# Patient Record
Sex: Male | Born: 1950
Health system: Southern US, Community
[De-identification: ages and names within clinical notes are randomized; demographics above are authoritative.]

## PROBLEM LIST (undated history)

## (undated) DIAGNOSIS — K4090 Unilateral inguinal hernia, without obstruction or gangrene, not specified as recurrent: Secondary | ICD-10-CM

## (undated) DIAGNOSIS — G479 Sleep disorder, unspecified: Secondary | ICD-10-CM

## (undated) DIAGNOSIS — N183 Chronic kidney disease, stage 3 unspecified: Secondary | ICD-10-CM

## (undated) DIAGNOSIS — K219 Gastro-esophageal reflux disease without esophagitis: Secondary | ICD-10-CM

## (undated) DIAGNOSIS — I10 Essential (primary) hypertension: Secondary | ICD-10-CM

## (undated) DIAGNOSIS — E785 Hyperlipidemia, unspecified: Secondary | ICD-10-CM

## (undated) DIAGNOSIS — IMO0001 Reserved for inherently not codable concepts without codable children: Secondary | ICD-10-CM

## (undated) DIAGNOSIS — M545 Low back pain, unspecified: Secondary | ICD-10-CM

## (undated) DIAGNOSIS — E559 Vitamin D deficiency, unspecified: Secondary | ICD-10-CM

## (undated) HISTORY — DX: Gastro-esophageal reflux disease without esophagitis: K21.9

## (undated) HISTORY — DX: Reserved for inherently not codable concepts without codable children: IMO0001

## (undated) HISTORY — DX: Sleep disorder, unspecified: G47.9

## (undated) HISTORY — DX: Unilateral inguinal hernia, without obstruction or gangrene, not specified as recurrent: K40.90

## (undated) HISTORY — DX: Hyperlipidemia, unspecified: E78.5

## (undated) HISTORY — DX: Low back pain: M54.5

## (undated) HISTORY — DX: Vitamin D deficiency, unspecified: E55.9

## (undated) HISTORY — DX: Essential (primary) hypertension: I10

## (undated) HISTORY — PX: HERNIA REPAIR: SHX51

## (undated) HISTORY — DX: Chronic kidney disease, stage 3 unspecified: N18.30

## (undated) HISTORY — DX: Low back pain, unspecified: M54.50

## (undated) HISTORY — DX: Chronic kidney disease, stage 3 (moderate): N18.3

---

## 2003-09-12 ENCOUNTER — Ambulatory Visit (HOSPITAL_COMMUNITY): Admission: RE | Admit: 2003-09-12 | Discharge: 2003-09-12 | Payer: Self-pay | Admitting: Gastroenterology

## 2004-10-28 ENCOUNTER — Ambulatory Visit (HOSPITAL_COMMUNITY): Admission: RE | Admit: 2004-10-28 | Discharge: 2004-10-28 | Payer: Self-pay | Admitting: Surgery

## 2004-10-28 ENCOUNTER — Ambulatory Visit (HOSPITAL_BASED_OUTPATIENT_CLINIC_OR_DEPARTMENT_OTHER): Admission: RE | Admit: 2004-10-28 | Discharge: 2004-10-28 | Payer: Self-pay | Admitting: Surgery

## 2004-11-22 ENCOUNTER — Ambulatory Visit (HOSPITAL_COMMUNITY): Admission: RE | Admit: 2004-11-22 | Discharge: 2004-11-22 | Payer: Self-pay | Admitting: Internal Medicine

## 2011-02-08 ENCOUNTER — Other Ambulatory Visit: Payer: Self-pay | Admitting: Family Medicine

## 2011-02-08 DIAGNOSIS — Z87891 Personal history of nicotine dependence: Secondary | ICD-10-CM

## 2011-02-08 DIAGNOSIS — Z801 Family history of malignant neoplasm of trachea, bronchus and lung: Secondary | ICD-10-CM

## 2011-02-09 ENCOUNTER — Other Ambulatory Visit: Payer: Self-pay

## 2011-02-09 ENCOUNTER — Ambulatory Visit
Admission: RE | Admit: 2011-02-09 | Discharge: 2011-02-09 | Disposition: A | Discharge status: Home / Self Care | Payer: 59 | Source: Ambulatory Visit | Attending: Family Medicine | Admitting: Family Medicine

## 2011-02-09 DIAGNOSIS — Z87891 Personal history of nicotine dependence: Secondary | ICD-10-CM

## 2011-02-09 DIAGNOSIS — Z801 Family history of malignant neoplasm of trachea, bronchus and lung: Secondary | ICD-10-CM

## 2012-10-24 ENCOUNTER — Ambulatory Visit (INDEPENDENT_AMBULATORY_CARE_PROVIDER_SITE_OTHER): Payer: 59 | Admitting: General Surgery

## 2012-10-26 ENCOUNTER — Encounter (INDEPENDENT_AMBULATORY_CARE_PROVIDER_SITE_OTHER): Payer: Self-pay | Admitting: General Surgery

## 2012-10-26 ENCOUNTER — Telehealth (INDEPENDENT_AMBULATORY_CARE_PROVIDER_SITE_OTHER): Payer: Self-pay | Admitting: General Surgery

## 2012-10-26 NOTE — Telephone Encounter (Signed)
Called [place] at [facility] [place] [phone]) and requested copy of the most recent tests on this patient to confirm the diagnosis given for visit with Dr. Derrell Lolling next week.

## 2012-10-31 ENCOUNTER — Ambulatory Visit (INDEPENDENT_AMBULATORY_CARE_PROVIDER_SITE_OTHER): Payer: 59 | Admitting: General Surgery

## 2012-10-31 ENCOUNTER — Encounter (INDEPENDENT_AMBULATORY_CARE_PROVIDER_SITE_OTHER): Payer: Self-pay | Admitting: General Surgery

## 2012-10-31 VITALS — BP 126/80 | HR 80 | Temp 97.8°F | Resp 18 | Ht 69.0 in | Wt 190.0 lb

## 2012-10-31 DIAGNOSIS — K4090 Unilateral inguinal hernia, without obstruction or gangrene, not specified as recurrent: Secondary | ICD-10-CM

## 2012-10-31 NOTE — Patient Instructions (Signed)
You have a small right inguinal hernia. We have discussed the options for surgical repair.  Since your pain has gotten better and the hernia is small, surgical repair of this is elective..  Call Dr. Derrell Lolling back when you are ready to schedule the surgery.     Inguinal Hernia, Adult Muscles help keep everything in the body in its proper place. But if a weak spot in the muscles develops, something can poke through. That is called a hernia. When this happens in the lower part of the belly (abdomen), it is called an inguinal hernia. (It takes its name from a part of the body in this region called the inguinal canal.) A weak spot in the wall of muscles lets some fat or part of the small intestine bulge through. An inguinal hernia can develop at any age. Men get them more often than women. CAUSES  In adults, an inguinal hernia develops over time.  It can be triggered by:  Suddenly straining the muscles of the lower abdomen.  Lifting heavy objects.  Straining to have a bowel movement. Difficult bowel movements (constipation) can lead to this.  Constant coughing. This may be caused by smoking or lung disease.  Being overweight.  Being pregnant.  Working at a job that requires long periods of standing or heavy lifting.  Having had an inguinal hernia before. One type can be an emergency situation. It is called a strangulated inguinal hernia. It develops if part of the small intestine slips through the weak spot and cannot get back into the abdomen. The blood supply can be cut off. If that happens, part of the intestine may die. This situation requires emergency surgery. SYMPTOMS  Often, a small inguinal hernia has no symptoms. It is found when a healthcare provider does a physical exam. Larger hernias usually have symptoms.   In adults, symptoms may include:  A lump in the groin. This is easier to see when the person is standing. It might disappear when lying down.  In men, a lump in the  scrotum.  Pain or burning in the groin. This occurs especially when lifting, straining or coughing.  A dull ache or feeling of pressure in the groin.  Signs of a strangulated hernia can include:  A bulge in the groin that becomes very painful and tender to the touch.  A bulge that turns red or purple.  Fever, nausea and vomiting.  Inability to have a bowel movement or to pass gas. DIAGNOSIS  To decide if you have an inguinal hernia, a healthcare provider will probably do a physical examination.  This will include asking questions about any symptoms you have noticed.  The healthcare provider might feel the groin area and ask you to cough. If an inguinal hernia is felt, the healthcare provider may try to slide it back into the abdomen.  Usually no other tests are needed. TREATMENT  Treatments can vary. The size of the hernia makes a difference. Options include:  Watchful waiting. This is often suggested if the hernia is small and you have had no symptoms.  No medical procedure will be done unless symptoms develop.  You will need to watch closely for symptoms. If any occur, contact your healthcare provider right away.  Surgery. This is used if the hernia is larger or you have symptoms.  Open surgery. This is usually an outpatient procedure (you will not stay overnight in a hospital). An cut (incision) is made through the skin in the groin. The hernia  is put back inside the abdomen. The weak area in the muscles is then repaired by herniorrhaphy or hernioplasty. Herniorrhaphy: in this type of surgery, the weak muscles are sewn back together. Hernioplasty: a patch or mesh is used to close the weak area in the abdominal wall.  Laparoscopy. In this procedure, a surgeon makes small incisions. A thin tube with a tiny video camera (called a laparoscope) is put into the abdomen. The surgeon repairs the hernia with mesh by looking with the video camera and using two long instruments. HOME  CARE INSTRUCTIONS   After surgery to repair an inguinal hernia:  You will need to take pain medicine prescribed by your healthcare provider. Follow all directions carefully.  You will need to take care of the wound from the incision.  Your activity will be restricted for awhile. This will probably include no heavy lifting for several weeks. You also should not do anything too active for a few weeks. When you can return to work will depend on the type of job that you have.  During "watchful waiting" periods, you should:  Maintain a healthy weight.  Eat a diet high in fiber (fruits, vegetables and whole grains).  Drink plenty of fluids to avoid constipation. This means drinking enough water and other liquids to keep your urine clear or pale yellow.  Do not lift heavy objects.  Do not stand for long periods of time.  Quit smoking. This should keep you from developing a frequent cough. SEEK MEDICAL CARE IF:   A bulge develops in your groin area.  You feel pain, a burning sensation or pressure in the groin. This might be worse if you are lifting or straining.  You develop a fever of more than 100.5 F (38.1 C). SEEK IMMEDIATE MEDICAL CARE IF:   Pain in the groin increases suddenly.  A bulge in the groin gets bigger suddenly and does not go down.  For men, there is sudden pain in the scrotum. Or, the size of the scrotum increases.  A bulge in the groin area becomes red or purple and is painful to touch.  You have nausea or vomiting that does not go away.  You feel your heart beating much faster than normal.  You cannot have a bowel movement or pass gas.  You develop a fever of more than 102.0 F (38.9 C). Document Released: 02/13/2009 Document Revised: 12/20/2011 Document Reviewed: 02/13/2009 [facility] Patient Information 2013 [place], [place].   Inguinal Hernia, Adult  Care After Refer to this sheet in the next few weeks. These discharge instructions provide you with  general information on caring for yourself after you leave the hospital. Your caregiver may also give you specific instructions. Your treatment has been planned according to the most current medical practices available, but unavoidable complications sometimes occur. If you have any problems or questions after discharge, please call your caregiver. HOME CARE INSTRUCTIONS  Put ice on the operative site.  Put ice in a plastic bag.  Place a towel between your skin and the bag.  Leave the ice on for 15 to 20 minutes at a time, 3 to 4 times a day while awake.  Change bandages (dressings) as directed.  Keep the wound dry and clean. The wound may be washed gently with soap and water. Gently blot or dab the wound dry. It is okay to take showers 24 to 48 hours after surgery. Do not take baths, use swimming pools, or use hot tubs for 10 days, or  as directed by your caregiver.  Only take over-the-counter or prescription medicines for pain, discomfort, or fever as directed by your caregiver.  Continue your normal diet as directed.  Do not lift anything more than 10 pounds or play contact sports for 3 weeks, or as directed. SEEK MEDICAL CARE IF:  There is redness, swelling, or increasing pain in the wound.  There is fluid (pus) coming from the wound.  There is drainage from a wound lasting longer than 1 day.  You have an oral temperature above 102 F (38.9 C).  You notice a bad smell coming from the wound or dressing.  The wound breaks open after the stitches (sutures) have been removed.  You notice increasing pain in the shoulders (shoulder strap areas).  You develop dizzy episodes or fainting while standing.  You feel sick to your stomach (nauseous) or throw up (vomit). SEEK IMMEDIATE MEDICAL CARE IF:  You develop a rash.  You have difficulty breathing.  You develop a reaction or have side effects to medicines you were given. MAKE SURE YOU:   Understand these  instructions.  Will watch your condition.  Will get help right away if you are not doing well or get worse. Document Released: 10/28/2006 Document Revised: 12/20/2011 Document Reviewed: 08/27/2009 [facility] Patient Information 2013 [place], [place].

## 2012-10-31 NOTE — Progress Notes (Signed)
Patient ID: Carlos Young, male   DOB: 1951/02/01, 62 y.o.   MRN: [id number]  Chief Complaint  Patient presents with  . New Evaluation    groin hernia    HPI Carlos Young is a 62 y.o. male.  He is referred to me by Dr. Vianne Bulls for evaluation of a right inguinal hernia.  The patient has had an umbilical hernia repair with mesh a few years ago, but has never had an inguinal hernia. About 4 months ago he had a bad cold and was coughing and sneezing and noticed pain and a small bulge in his right groin. This has gotten better and although he still feels a little bit of  discomfort it is not interfering with his activities. He is here for evaluation.  Comorbidities include hypertension and hyperlipidemia, otherwise well.  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 HPI  Past Medical History  Diagnosis Date  . Hypertension   . Hyperlipidemia   . GERD (gastroesophageal reflux disease)   . Inguinal hernia   . Low back pain   . Myalgia and myositis, unspecified   . Sleep disturbance, unspecified   . Unspecified vitamin D deficiency   . Chronic kidney disease, stage III (moderate)     Past Surgical History  Procedure Date  . Hernia repair 2008 est    ing. hernia    Family History  Problem Relation Age of Onset  . Cancer Mother     lung ca  . Stroke Father   . Cancer Sister     breast ca  late 81s   . Stroke Sister   . Arthritis Sister     Social History History  Substance Use Topics  . Smoking status: Former Smoker    Quit date: 10/26/1992  . Smokeless tobacco: Never Used  . Alcohol Use: 0.6 oz/week    1 Cans of beer per week     Comment: per week or less    Allergies  Allergen Reactions  . Ace Inhibitors   . Penicillins   . Sulfa Antibiotics     Current Outpatient Prescriptions  Medication Sig Dispense Refill  . amlodipine-atorvastatin (CADUET) 10-20 MG per tablet Take 1 tablet by mouth daily.       Marland Kitchen dexlansoprazole (DEXILANT) 60 MG capsule Take 60 mg by mouth daily.      Marland Kitchen olmesartan (BENICAR) 20 MG tablet Take 20 mg by mouth daily.      . tadalafil (CIALIS) 20 MG tablet Take 20 mg by mouth daily as needed.      . cholecalciferol (VITAMIN D) 1000 UNITS tablet Take 1,000 Units by mouth daily.        Review of Systems Review of Systems  Constitutional: Negative for fever, chills and unexpected weight change.  HENT: Negative for hearing loss, congestion, sore throat, trouble swallowing and voice change.   Eyes: Negative for visual disturbance.  Respiratory: Negative for cough and wheezing.   Cardiovascular: Negative for chest pain, palpitations and leg swelling.  Gastrointestinal: Negative for nausea, vomiting, abdominal pain, diarrhea, constipation, blood in stool, abdominal distention, anal bleeding and rectal pain.  Genitourinary: Negative for hematuria and difficulty urinating.  Musculoskeletal: Negative for arthralgias.  Skin: Negative for rash and wound.  Neurological: Negative for seizures, syncope, weakness and headaches.  Hematological: Negative for adenopathy. Does not bruise/bleed easily.  Psychiatric/Behavioral: Negative for confusion.    Blood pressure  126/80, pulse 80, temperature 97.8 F (36.6 C), resp. rate 18, height 5\' 9"  (1.753 m), weight 190 lb (86.183 kg).  Physical Exam Physical Exam  Constitutional: He is oriented to person, place, and time. He appears well-developed and well-nourished. No distress.  HENT:  Head: Normocephalic.  Nose: Nose normal.  Mouth/Throat: No oropharyngeal exudate.  Eyes: Conjunctivae normal and EOM are normal. Pupils are equal, round, and reactive to light. Right eye exhibits no discharge. Left eye exhibits no discharge. No scleral icterus.  Neck: Normal range of motion. Neck supple. No JVD present. No tracheal deviation present. No thyromegaly present.  Cardiovascular: Normal rate, regular rhythm, normal heart sounds and  intact distal pulses.   No murmur heard. Pulmonary/Chest: Effort normal and breath sounds normal. No stridor. No respiratory distress. He has no wheezes. He has no rales. He exhibits no tenderness.  Abdominal: Soft. Bowel sounds are normal. He exhibits no distension and no mass. There is no tenderness. There is no rebound and no guarding.       Transverse scar below umbilicus. Well-healed. No hernia. Small right inguinal hernia, detectable only when standing,subtle. No evidence of hernia on the left. Testes atrophic. No scrotal mass.  Musculoskeletal: Normal range of motion. He exhibits no edema and no tenderness.  Lymphadenopathy:    He has no cervical adenopathy.  Neurological: He is alert and oriented to person, place, and time. He has normal reflexes. Coordination normal.  Skin: Skin is warm and dry. No rash noted. He is not diaphoretic. No erythema. No pallor.  Psychiatric: He has a normal mood and affect. His behavior is normal. Judgment and thought content normal.    Data Reviewed Office notes from Dr. Tenny Craw Assessment    Small, reducible, minimally symptomatic right inguinal hernia  Hypertension  Hyperlipidemia  History of umbilical hernia repair with mesh    Plan    I told the patient that he has a small right inguinal hernia, and that this would probably need to be repaired at some point. At this point in time it is minimally symptomatic and is not interfering with his activities, and so repair is elective and up to him  I discussed the indications, details, techniques, and numerous risks of the surgery with him. He understands all these issues. All his questions are answered.  At this point he wants to go home and think about this. He may want to put this off or might change his mind. He will call me back when he is ready to schedule.  No restrictions on his activities in the interim.       Angelia Mould. Derrell Lolling, M.D., [facility] General and  Minimally invasive Surgery Breast and Colorectal Surgery Office:   [phone] Pager:   [phone]  10/31/2012, 9:21 AM

## 2013-09-24 ENCOUNTER — Encounter: Payer: Self-pay | Admitting: *Deleted

## 2014-07-30 ENCOUNTER — Other Ambulatory Visit: Payer: Self-pay | Admitting: Family Medicine

## 2014-07-30 DIAGNOSIS — R1013 Epigastric pain: Secondary | ICD-10-CM

## 2014-08-07 ENCOUNTER — Ambulatory Visit
Admission: RE | Admit: 2014-08-07 | Discharge: 2014-08-07 | Disposition: A | Discharge status: Home / Self Care | Payer: 59 | Source: Ambulatory Visit | Attending: Family Medicine | Admitting: Family Medicine

## 2014-08-07 DIAGNOSIS — R1013 Epigastric pain: Secondary | ICD-10-CM

## 2014-08-08 ENCOUNTER — Other Ambulatory Visit: Payer: Self-pay | Admitting: Family Medicine

## 2014-08-08 DIAGNOSIS — R1013 Epigastric pain: Secondary | ICD-10-CM

## 2014-08-09 ENCOUNTER — Ambulatory Visit
Admission: RE | Admit: 2014-08-09 | Discharge: 2014-08-09 | Disposition: A | Discharge status: Home / Self Care | Payer: 59 | Source: Ambulatory Visit | Attending: Family Medicine | Admitting: Family Medicine

## 2014-08-09 DIAGNOSIS — R1013 Epigastric pain: Secondary | ICD-10-CM

## 2014-09-30 ENCOUNTER — Emergency Department (HOSPITAL_COMMUNITY)
Admission: EM | Admit: 2014-09-30 | Discharge: 2014-09-30 | Disposition: A | Discharge status: Home / Self Care | Payer: 59 | Attending: Emergency Medicine | Admitting: Emergency Medicine

## 2014-09-30 ENCOUNTER — Encounter (HOSPITAL_COMMUNITY): Payer: Self-pay | Admitting: *Deleted

## 2014-09-30 ENCOUNTER — Emergency Department (HOSPITAL_COMMUNITY): Payer: 59

## 2014-09-30 DIAGNOSIS — K5792 Diverticulitis of intestine, part unspecified, without perforation or abscess without bleeding: Secondary | ICD-10-CM

## 2014-09-30 DIAGNOSIS — Z880 Allergy status to penicillin: Secondary | ICD-10-CM | POA: Insufficient documentation

## 2014-09-30 DIAGNOSIS — Z8669 Personal history of other diseases of the nervous system and sense organs: Secondary | ICD-10-CM | POA: Diagnosis not present

## 2014-09-30 DIAGNOSIS — Z87891 Personal history of nicotine dependence: Secondary | ICD-10-CM | POA: Insufficient documentation

## 2014-09-30 DIAGNOSIS — R109 Unspecified abdominal pain: Secondary | ICD-10-CM | POA: Diagnosis present

## 2014-09-30 DIAGNOSIS — E785 Hyperlipidemia, unspecified: Secondary | ICD-10-CM | POA: Insufficient documentation

## 2014-09-30 DIAGNOSIS — R630 Anorexia: Secondary | ICD-10-CM | POA: Diagnosis not present

## 2014-09-30 DIAGNOSIS — Z79899 Other long term (current) drug therapy: Secondary | ICD-10-CM | POA: Diagnosis not present

## 2014-09-30 DIAGNOSIS — N183 Chronic kidney disease, stage 3 (moderate): Secondary | ICD-10-CM | POA: Diagnosis not present

## 2014-09-30 DIAGNOSIS — R52 Pain, unspecified: Secondary | ICD-10-CM

## 2014-09-30 DIAGNOSIS — K219 Gastro-esophageal reflux disease without esophagitis: Secondary | ICD-10-CM | POA: Insufficient documentation

## 2014-09-30 DIAGNOSIS — I129 Hypertensive chronic kidney disease with stage 1 through stage 4 chronic kidney disease, or unspecified chronic kidney disease: Secondary | ICD-10-CM | POA: Diagnosis not present

## 2014-09-30 DIAGNOSIS — E559 Vitamin D deficiency, unspecified: Secondary | ICD-10-CM | POA: Insufficient documentation

## 2014-09-30 DIAGNOSIS — Z905 Acquired absence of kidney: Secondary | ICD-10-CM

## 2014-09-30 LAB — COMPREHENSIVE METABOLIC PANEL
ALBUMIN: 3.7 g/dL (ref 3.5–5.2)
ALT: 12 U/L (ref 0–53)
AST: 17 U/L (ref 0–37)
Alkaline Phosphatase: 116 U/L (ref 39–117)
Anion gap: 10 (ref 5–15)
BILIRUBIN TOTAL: 0.9 mg/dL (ref 0.3–1.2)
BUN: 21 mg/dL (ref 6–23)
CHLORIDE: 102 meq/L (ref 96–112)
CO2: 26 meq/L (ref 19–32)
Calcium: 9.7 mg/dL (ref 8.4–10.5)
Creatinine, Ser: 1.68 mg/dL — ABNORMAL HIGH (ref 0.50–1.35)
GFR calc Af Amer: 49 mL/min — ABNORMAL LOW (ref >90)
GFR, EST NON AFRICAN AMERICAN: 42 mL/min — AB (ref >90)
GLUCOSE: 111 mg/dL — AB (ref 70–99)
POTASSIUM: 4.4 meq/L (ref 3.7–5.3)
SODIUM: 138 meq/L (ref 137–147)
Total Protein: 7.5 g/dL (ref 6.0–8.3)

## 2014-09-30 LAB — URINALYSIS, ROUTINE W REFLEX MICROSCOPIC
Bilirubin Urine: NEGATIVE
Glucose, UA: NEGATIVE mg/dL
Hgb urine dipstick: NEGATIVE
Ketones, ur: NEGATIVE mg/dL
LEUKOCYTES UA: NEGATIVE
Nitrite: NEGATIVE
PROTEIN: NEGATIVE mg/dL
Specific Gravity, Urine: 1.018 (ref 1.005–1.030)
UROBILINOGEN UA: 1 mg/dL (ref 0.0–1.0)
pH: 7 (ref 5.0–8.0)

## 2014-09-30 LAB — CBC
HCT: 44.1 % (ref 39.0–52.0)
HEMOGLOBIN: 14.8 g/dL (ref 13.0–17.0)
MCH: 30.3 pg (ref 26.0–34.0)
MCHC: 33.6 g/dL (ref 30.0–36.0)
MCV: 90.4 fL (ref 78.0–100.0)
Platelets: 254 10*3/uL (ref 150–400)
RBC: 4.88 MIL/uL (ref 4.22–5.81)
RDW: 13 % (ref 11.5–15.5)
WBC: 7.5 10*3/uL (ref 4.0–10.5)

## 2014-09-30 MED ORDER — IOHEXOL 300 MG/ML  SOLN
25.0000 mL | Freq: Once | INTRAMUSCULAR | Status: AC | PRN
  Administered 2014-09-30: 25 mL via ORAL

## 2014-09-30 MED ORDER — METRONIDAZOLE IN NACL 5-0.79 MG/ML-% IV SOLN
500.0000 mg | Freq: Once | INTRAVENOUS | Status: AC
  Administered 2014-09-30: 500 mg via INTRAVENOUS
  Filled 2014-09-30: qty 100

## 2014-09-30 MED ORDER — HYDROMORPHONE HCL 1 MG/ML IJ SOLN
0.5000 mg | Freq: Once | INTRAMUSCULAR | Status: AC
  Administered 2014-09-30: 0.5 mg via INTRAVENOUS
  Filled 2014-09-30: qty 1

## 2014-09-30 MED ORDER — CIPROFLOXACIN HCL 500 MG PO TABS: 500.0000 mg | ORAL_TABLET | Freq: Two times a day (BID) | ORAL | Status: DC

## 2014-09-30 MED ORDER — HYDROCODONE-ACETAMINOPHEN 5-325 MG PO TABS: 1.0000 | ORAL_TABLET | Freq: Four times a day (QID) | ORAL | Status: DC | PRN

## 2014-09-30 MED ORDER — CIPROFLOXACIN IN D5W 400 MG/200ML IV SOLN
400.0000 mg | Freq: Once | INTRAVENOUS | Status: AC
Start: 2014-09-30 — End: 2014-09-30
  Administered 2014-09-30: 400 mg via INTRAVENOUS
  Filled 2014-09-30: qty 200

## 2014-09-30 MED ORDER — ONDANSETRON 8 MG PO TBDP: 8.0000 mg | ORAL_TABLET | Freq: Three times a day (TID) | ORAL | Status: DC | PRN

## 2014-09-30 MED ORDER — SODIUM CHLORIDE 0.9 % IV BOLUS (SEPSIS)
1000.0000 mL | Freq: Once | INTRAVENOUS | Status: AC
  Administered 2014-09-30: 1000 mL via INTRAVENOUS

## 2014-09-30 MED ORDER — METRONIDAZOLE 500 MG PO TABS: 500.0000 mg | ORAL_TABLET | Freq: Four times a day (QID) | ORAL | Status: DC

## 2014-09-30 MED ORDER — ONDANSETRON HCL 4 MG/2ML IJ SOLN
4.0000 mg | Freq: Once | INTRAMUSCULAR | Status: AC
  Administered 2014-09-30: 4 mg via INTRAVENOUS
  Filled 2014-09-30: qty 2

## 2014-09-30 NOTE — ED Notes (Signed)
Patient returned from CT

## 2014-09-30 NOTE — ED Notes (Signed)
Pt remains monitored by blood pressure and pulse ox. Pts family remains at bedside.  

## 2014-09-30 NOTE — ED Provider Notes (Addendum)
CSN: [id number]     Arrival date & time 09/30/14  1018 History   First MD Initiated Contact with Patient 09/30/14 1028     Chief Complaint  Patient presents with  . Abdominal Pain  . Anorexia     (Consider location/radiation/quality/duration/timing/severity/associated sxs/prior Treatment) Patient is a 63 y.o. male presenting with abdominal pain. The history is provided by the patient.  Abdominal Pain Associated symptoms: nausea   Associated symptoms: no chest pain, no cough, no diarrhea, no dysuria, no fever, no hematuria, no shortness of breath, no sore throat and no vomiting   pt with hx diverticula, c/o LLQ pain the past couple days. Constant. Dull. Moderate. Non radiating. +decreased appetite. Nausea. No vomiting. No diarrhea, had normal bm yesterday. Denies hx same pain. No fever/chills/ no dysuria or gu c/o. No back or flank pain.     Past Medical History  Diagnosis Date  . Hypertension   . Hyperlipidemia   . GERD (gastroesophageal reflux disease)   . Inguinal hernia   . Low back pain   . Myalgia and myositis, unspecified   . Sleep disturbance, unspecified   . Unspecified vitamin D deficiency   . Chronic kidney disease, stage III (moderate)    Past Surgical History  Procedure Laterality Date  . Hernia repair  2008 est    ing. hernia   Family History  Problem Relation Age of Onset  . Cancer Mother     lung ca  . Stroke Father   . Cancer Sister     breast ca  late 47s   . Stroke Sister   . Arthritis Sister    History  Substance Use Topics  . Smoking status: Former Smoker    Quit date: 10/26/1992  . Smokeless tobacco: Never Used  . Alcohol Use: 0.6 oz/week    1 Cans of beer per week     Comment: per week or less    Review of Systems  Constitutional: Negative for fever and diaphoresis.  HENT: Negative for sore throat.   Eyes: Negative for redness.  Respiratory: Negative for cough and shortness of breath.   Cardiovascular: Negative for chest pain.   Gastrointestinal: Positive for nausea and abdominal pain. Negative for vomiting and diarrhea.  Endocrine: Negative for polyuria.  Genitourinary: Negative for dysuria, hematuria and flank pain.  Musculoskeletal: Negative for back pain and neck pain.  Skin: Negative for rash.  Neurological: Negative for headaches.  Hematological: Does not bruise/bleed easily.  Psychiatric/Behavioral: Negative for confusion.      Allergies  Ace inhibitors; Penicillins; and Sulfa antibiotics  Home Medications   Prior to Admission medications   Medication Sig Start Date End Date Taking? Authorizing Provider  amlodipine-atorvastatin (CADUET) 10-20 MG per tablet Take 1 tablet by mouth daily.    Historical Provider, MD  cholecalciferol (VITAMIN D) 1000 UNITS tablet Take 1,000 Units by mouth daily.    Historical Provider, MD  dexlansoprazole (DEXILANT) 60 MG capsule Take 60 mg by mouth daily.    Historical Provider, MD  olmesartan (BENICAR) 20 MG tablet Take 20 mg by mouth daily.    Historical Provider, MD  tadalafil (CIALIS) 20 MG tablet Take 20 mg by mouth daily as needed.    Historical Provider, MD   There were no vitals taken for this visit. Physical Exam  Constitutional: He is oriented to person, place, and time. He appears well-developed and well-nourished. No distress.  HENT:  Head: Atraumatic.  Eyes: Conjunctivae are normal. No scleral icterus.  Neck: Neck supple.  No tracheal deviation present.  Cardiovascular: Normal rate, regular rhythm, normal heart sounds and intact distal pulses.   Pulmonary/Chest: Effort normal and breath sounds normal. No accessory muscle usage. No respiratory distress.  Abdominal: Soft. Bowel sounds are normal. He exhibits no distension and no mass. There is tenderness. There is no rebound and no guarding.  Moderate LLQ tenderness. No incarc hernia.  Genitourinary:  No cva tenderness  Musculoskeletal: Normal range of motion. He exhibits no edema or tenderness.   Neurological: He is alert and oriented to person, place, and time.  Skin: Skin is warm and dry. No rash noted. He is not diaphoretic.  Psychiatric: He has a normal mood and affect.  Nursing note and vitals reviewed.   ED Course  Procedures (including critical care time) Labs Review  Results for orders placed or performed during the hospital encounter of 09/30/14  CBC  Result Value Ref Range   WBC 7.5 4.0 - 10.5 K/uL   RBC 4.88 4.22 - 5.81 MIL/uL   Hemoglobin 14.8 13.0 - 17.0 g/dL   HCT 45.444.1 09.839.0 - 11.952.0 %   MCV 90.4 78.0 - 100.0 fL   MCH 30.3 26.0 - 34.0 pg   MCHC 33.6 30.0 - 36.0 g/dL   RDW 14.713.0 82.911.5 - 56.215.5 %   Platelets 254 150 - 400 K/uL  Comprehensive metabolic panel  Result Value Ref Range   Sodium 138 137 - 147 mEq/L   Potassium 4.4 3.7 - 5.3 mEq/L   Chloride 102 96 - 112 mEq/L   CO2 26 19 - 32 mEq/L   Glucose, Bld 111 (H) 70 - 99 mg/dL   BUN 21 6 - 23 mg/dL   Creatinine, Ser 1.301.68 (H) 0.50 - 1.35 mg/dL   Calcium 9.7 8.4 - 86.510.5 mg/dL   Total Protein 7.5 6.0 - 8.3 g/dL   Albumin 3.7 3.5 - 5.2 g/dL   AST 17 0 - 37 U/L   ALT 12 0 - 53 U/L   Alkaline Phosphatase 116 39 - 117 U/L   Total Bilirubin 0.9 0.3 - 1.2 mg/dL   GFR calc non Af Amer 42 (L) >90 mL/min   GFR calc Af Amer 49 (L) >90 mL/min   Anion gap 10 5 - 15  Urinalysis, Routine w reflex microscopic  Result Value Ref Range   Color, Urine YELLOW YELLOW   APPearance CLOUDY (A) CLEAR   Specific Gravity, Urine 1.018 1.005 - 1.030   pH 7.0 5.0 - 8.0   Glucose, UA NEGATIVE NEGATIVE mg/dL   Hgb urine dipstick NEGATIVE NEGATIVE   Bilirubin Urine NEGATIVE NEGATIVE   Ketones, ur NEGATIVE NEGATIVE mg/dL   Protein, ur NEGATIVE NEGATIVE mg/dL   Urobilinogen, UA 1.0 0.0 - 1.0 mg/dL   Nitrite NEGATIVE NEGATIVE   Leukocytes, UA NEGATIVE NEGATIVE   Ct Abdomen Pelvis Wo Contrast  09/30/2014   CLINICAL DATA:  Abdominal pain since last night worse on LEFT, hypertension, stage III moderate chronic kidney disease, former  smoker  EXAM: CT ABDOMEN AND PELVIS WITHOUT CONTRAST  TECHNIQUE: Multidetector CT imaging of the abdomen and pelvis was performed following the standard protocol without IV contrast. Sagittal and coronal MPR images reconstructed from axial data set. Patient drank dilute oral contrast for exam  COMPARISON:  None  FINDINGS: Linear subsegmental atelectasis in the lower lobes.  Absent LEFT kidney with pancreatic tail in LEFT renal fossa.  Liver, spleen, pancreas, and adrenal glands normal for technique.  Multiple small peripelvic cysts RIGHT kidney.  Small to moderate-sized hiatal  hernia with retained contrast in distended esophagus question related to reflux.  Normal appendix.  Descending and sigmoid colonic diverticulosis with minimal stranding anterior to the distal descending colon suspicious for subtle acute diverticulitis.  Minimal associated colonic wall thickening.  Remaining bowel loops unremarkable.  Dilatation of RIGHT internal inguinal ring and proximal inguinal canal by fat.  Bladder and RIGHT ureter normal appearance.  No mass, adenopathy, free air, free fluid, or acute osseous findings.  IMPRESSION: Diverticulosis of the descending and sigmoid colon with subtle infiltrative changes in wall thickening at the distal descending colon suspicious for subtle acute diverticulitis.  Small to moderate-sized hiatal hernia with suspected gastroesophageal reflux.  Absent LEFT kidney with small peripelvic cysts RIGHT kidney.  RIGHT inguinal hernia containing fat.   Electronically Signed   By: Ulyses SouthwardMark  Boles M.D.   On: 09/30/2014 12:43       MDM   Iv ns bolus. Dilaudid .5 mg iv. zofran iv.  Reviewed nursing notes and prior charts for additional history.   Cr 1.6 - called ct, will change to ct without contrast.  Ct w ?early diverticulitis.  Flagyl and cipro iv.  Discussed ct w pt.  Po fluids. Ivf.  Recheck abd, mild llq tenderness, pain controlled.  Discussed tx options, admit vs home - pt states much  prefers d/c to home on po abx.  Discussed single kidney w pt - states they mentioned that to him on prior u/s. Pt states his baseline cr is 1.7.     Additional ivf.   Pt denies any faintness/lightheadedness or dizziness. No fever or chills.  States feels ready for d/c.   Return precautions discussed.  Pt currently appears stable for d/c.      Suzi RootsKevin E Hiawatha Merriott, MD 09/30/14 1356

## 2014-09-30 NOTE — ED Notes (Signed)
Pt remains monitored by blood pressure and pulse ox.  

## 2014-09-30 NOTE — Discharge Instructions (Signed)
It was our pleasure to provide your ER care today - we hope that you feel better.  Rest. Drink plenty of fluids.  Take cipro and flagyl (antibiotics) as prescribed. Do not drink alcohol when taking flagyl.  You may take hydrocodone as need for pain. No driving when taking hydrocodone. Also, do not take tylenol or acetaminophen containing medication when taking hydrocodone.  You may take zofran as need for nausea.  Follow up with primary care doctor in the next 1-2 weeks - inform them also of your single kidney.  For your inguinal hernia, follow up with your general surgeon.     Return to ER if worse, new symptoms, persistent vomiting, worsening or severe pain, faint/weak, other concern.  You were given pain medication in the ER - no driving for the next 4 hours.     Diverticulitis Diverticulitis is inflammation or infection of small pouches in your colon that form when you have a condition called diverticulosis. The pouches in your colon are called diverticula. Your colon, or large intestine, is where water is absorbed and stool is formed. Complications of diverticulitis can include:  Bleeding.  Severe infection.  Severe pain.  Perforation of your colon.  Obstruction of your colon. CAUSES  Diverticulitis is caused by bacteria. Diverticulitis happens when stool becomes trapped in diverticula. This allows bacteria to grow in the diverticula, which can lead to inflammation and infection. RISK FACTORS People with diverticulosis are at risk for diverticulitis. Eating a diet that does not include enough fiber from fruits and vegetables may make diverticulitis more likely to develop. SYMPTOMS  Symptoms of diverticulitis may include:  Abdominal pain and tenderness. The pain is normally located on the left side of the abdomen, but may occur in other areas.  Fever and chills.  Bloating.  Cramping.  Nausea.  Vomiting.  Constipation.  Diarrhea.  Blood in your  stool. DIAGNOSIS  Your health care provider will ask you about your medical history and do a physical exam. You may need to have tests done because many medical conditions can cause the same symptoms as diverticulitis. Tests may include:  Blood tests.  Urine tests.  Imaging tests of the abdomen, including X-rays and CT scans. When your condition is under control, your health care provider may recommend that you have a colonoscopy. A colonoscopy can show how severe your diverticula are and whether something else is causing your symptoms. TREATMENT  Most cases of diverticulitis are mild and can be treated at home. Treatment may include:  Taking over-the-counter pain medicines.  Following a clear liquid diet.  Taking antibiotic medicines by mouth for 7-10 days. More severe cases may be treated at a hospital. Treatment may include:  Not eating or drinking.  Taking prescription pain medicine.  Receiving antibiotic medicines through an IV tube.  Receiving fluids and nutrition through an IV tube.  Surgery. HOME CARE INSTRUCTIONS   Follow your health care provider's instructions carefully.  Follow a full liquid diet or other diet as directed by your health care provider. After your symptoms improve, your health care provider may tell you to change your diet. He or she may recommend you eat a high-fiber diet. Fruits and vegetables are good sources of fiber. Fiber makes it easier to pass stool.  Take fiber supplements or probiotics as directed by your health care provider.  Only take medicines as directed by your health care provider.  Keep all your follow-up appointments. SEEK MEDICAL CARE IF:   Your pain does not improve.  You have a hard time eating food.  Your bowel movements do not return to normal. SEEK IMMEDIATE MEDICAL CARE IF:   Your pain becomes worse.  Your symptoms do not get better.  Your symptoms suddenly get worse.  You have a fever.  You have repeated  vomiting.  You have bloody or black, tarry stools. MAKE SURE YOU:   Understand these instructions.  Will watch your condition.  Will get help right away if you are not doing well or get worse. Document Released: 07/07/2005 Document Revised: 10/02/2013 Document Reviewed: 08/22/2013 [facility] Patient Information 2015 [place], [place]. This information is not intended to replace advice given to you by your health care provider. Make sure you discuss any questions you have with your health care provider.    Hernia A hernia occurs when an internal organ pushes out through a weak spot in the abdominal wall. Hernias most commonly occur in the groin and around the navel. Hernias often can be pushed back into place (reduced). Most hernias tend to get worse over time. Some abdominal hernias can get stuck in the opening (irreducible or incarcerated hernia) and cannot be reduced. An irreducible abdominal hernia which is tightly squeezed into the opening is at risk for impaired blood supply (strangulated hernia). A strangulated hernia is a medical emergency. Because of the risk for an irreducible or strangulated hernia, surgery may be recommended to repair a hernia. CAUSES   Heavy lifting.  Prolonged coughing.  Straining to have a bowel movement.  A cut (incision) made during an abdominal surgery. HOME CARE INSTRUCTIONS   Bed rest is not required. You may continue your normal activities.  Avoid lifting more than 10 pounds (4.5 kg) or straining.  Cough gently. If you are a smoker it is best to stop. Even the best hernia repair can break down with the continual strain of coughing. Even if you do not have your hernia repaired, a cough will continue to aggravate the problem.  Do not wear anything tight over your hernia. Do not try to keep it in with an outside bandage or truss. These can damage abdominal contents if they are trapped within the hernia sac.  Eat a normal diet.  Avoid constipation.  Straining over long periods of time will increase hernia size and encourage breakdown of repairs. If you cannot do this with diet alone, stool softeners may be used. SEEK IMMEDIATE MEDICAL CARE IF:   You have a fever.  You develop increasing abdominal pain.  You feel nauseous or vomit.  Your hernia is stuck outside the abdomen, looks discolored, feels hard, or is tender.  You have any changes in your bowel habits or in the hernia that are unusual for you.  You have increased pain or swelling around the hernia.  You cannot push the hernia back in place by applying gentle pressure while lying down. MAKE SURE YOU:   Understand these instructions.  Will watch your condition.  Will get help right away if you are not doing well or get worse. Document Released: 09/27/2005 Document Revised: 12/20/2011 Document Reviewed: 05/16/2008 [facility] Patient Information 2015 [place], [place]. This information is not intended to replace advice given to you by your health care provider. Make sure you discuss any questions you have with your health care provider.

## 2014-09-30 NOTE — ED Notes (Signed)
Patient waiting for Flagyl to complete.

## 2014-09-30 NOTE — ED Notes (Signed)
Took over care of the patient at this time. Waiting for CT to transport.

## 2016-03-29 ENCOUNTER — Other Ambulatory Visit: Payer: Self-pay | Admitting: Physician Assistant

## 2016-03-29 DIAGNOSIS — Z8719 Personal history of other diseases of the digestive system: Secondary | ICD-10-CM

## 2016-03-29 DIAGNOSIS — R1032 Left lower quadrant pain: Secondary | ICD-10-CM

## 2016-03-30 ENCOUNTER — Ambulatory Visit
Admission: RE | Admit: 2016-03-30 | Discharge: 2016-03-30 | Disposition: A | Discharge status: Home / Self Care | Payer: 59 | Source: Ambulatory Visit | Attending: Physician Assistant | Admitting: Physician Assistant

## 2016-03-30 DIAGNOSIS — R1032 Left lower quadrant pain: Secondary | ICD-10-CM

## 2016-03-30 DIAGNOSIS — Z8719 Personal history of other diseases of the digestive system: Secondary | ICD-10-CM

## 2017-01-18 DIAGNOSIS — L299 Pruritus, unspecified: Secondary | ICD-10-CM | POA: Diagnosis not present

## 2017-03-03 DIAGNOSIS — Z0000 Encounter for general adult medical examination without abnormal findings: Secondary | ICD-10-CM | POA: Diagnosis not present

## 2017-03-03 DIAGNOSIS — Z125 Encounter for screening for malignant neoplasm of prostate: Secondary | ICD-10-CM | POA: Diagnosis not present

## 2017-03-03 DIAGNOSIS — I10 Essential (primary) hypertension: Secondary | ICD-10-CM | POA: Diagnosis not present

## 2017-03-03 DIAGNOSIS — Z23 Encounter for immunization: Secondary | ICD-10-CM | POA: Diagnosis not present

## 2017-03-03 DIAGNOSIS — E559 Vitamin D deficiency, unspecified: Secondary | ICD-10-CM | POA: Diagnosis not present

## 2017-03-03 DIAGNOSIS — Z1159 Encounter for screening for other viral diseases: Secondary | ICD-10-CM | POA: Diagnosis not present

## 2017-03-03 DIAGNOSIS — E7800 Pure hypercholesterolemia, unspecified: Secondary | ICD-10-CM | POA: Diagnosis not present

## 2017-03-03 DIAGNOSIS — N529 Male erectile dysfunction, unspecified: Secondary | ICD-10-CM | POA: Diagnosis not present

## 2017-03-03 DIAGNOSIS — K219 Gastro-esophageal reflux disease without esophagitis: Secondary | ICD-10-CM | POA: Diagnosis not present

## 2017-03-04 ENCOUNTER — Other Ambulatory Visit: Payer: Self-pay | Admitting: Family Medicine

## 2017-03-04 DIAGNOSIS — Z136 Encounter for screening for cardiovascular disorders: Secondary | ICD-10-CM

## 2017-03-04 DIAGNOSIS — Z125 Encounter for screening for malignant neoplasm of prostate: Secondary | ICD-10-CM | POA: Diagnosis not present

## 2017-03-04 DIAGNOSIS — E559 Vitamin D deficiency, unspecified: Secondary | ICD-10-CM | POA: Diagnosis not present

## 2017-03-04 DIAGNOSIS — E7800 Pure hypercholesterolemia, unspecified: Secondary | ICD-10-CM | POA: Diagnosis not present

## 2017-03-04 DIAGNOSIS — I10 Essential (primary) hypertension: Secondary | ICD-10-CM | POA: Diagnosis not present

## 2017-03-04 DIAGNOSIS — Z0000 Encounter for general adult medical examination without abnormal findings: Secondary | ICD-10-CM | POA: Diagnosis not present

## 2017-03-04 DIAGNOSIS — K219 Gastro-esophageal reflux disease without esophagitis: Secondary | ICD-10-CM | POA: Diagnosis not present

## 2017-03-04 DIAGNOSIS — Z1159 Encounter for screening for other viral diseases: Secondary | ICD-10-CM | POA: Diagnosis not present

## 2017-03-04 DIAGNOSIS — N529 Male erectile dysfunction, unspecified: Secondary | ICD-10-CM | POA: Diagnosis not present

## 2017-03-08 ENCOUNTER — Ambulatory Visit
Admission: RE | Admit: 2017-03-08 | Discharge: 2017-03-08 | Disposition: A | Discharge status: Home / Self Care | Payer: 59 | Source: Ambulatory Visit | Attending: Family Medicine | Admitting: Family Medicine

## 2017-03-08 DIAGNOSIS — Z136 Encounter for screening for cardiovascular disorders: Secondary | ICD-10-CM

## 2017-03-08 DIAGNOSIS — Z87891 Personal history of nicotine dependence: Secondary | ICD-10-CM | POA: Diagnosis not present

## 2017-03-22 DIAGNOSIS — H6123 Impacted cerumen, bilateral: Secondary | ICD-10-CM | POA: Diagnosis not present

## 2017-03-22 DIAGNOSIS — H60333 Swimmer's ear, bilateral: Secondary | ICD-10-CM | POA: Diagnosis not present

## 2017-04-20 DIAGNOSIS — K5730 Diverticulosis of large intestine without perforation or abscess without bleeding: Secondary | ICD-10-CM | POA: Diagnosis not present

## 2017-04-20 DIAGNOSIS — K635 Polyp of colon: Secondary | ICD-10-CM | POA: Diagnosis not present

## 2017-04-20 DIAGNOSIS — Z86010 Personal history of colonic polyps: Secondary | ICD-10-CM | POA: Diagnosis not present

## 2017-04-22 DIAGNOSIS — K635 Polyp of colon: Secondary | ICD-10-CM | POA: Diagnosis not present

## 2017-06-29 DIAGNOSIS — Z23 Encounter for immunization: Secondary | ICD-10-CM | POA: Diagnosis not present

## 2017-08-08 IMAGING — US US AORTA
1 series · 8 of 8 positions shown · non-contrast
Comparison: None.

CLINICAL DATA: Male between 65-75 years of age with a smoking
history.

EXAM:
US ABDOMINAL AORTA MEDICARE SCREENING
TECHNIQUE: Ultrasound examination of the abdominal aorta was performed as a
screening evaluation for abdominal aortic aneurysm.

[Series 1: us aorta · 0.22mm/px · 8 of 8 slices shown]
[im 1/8]
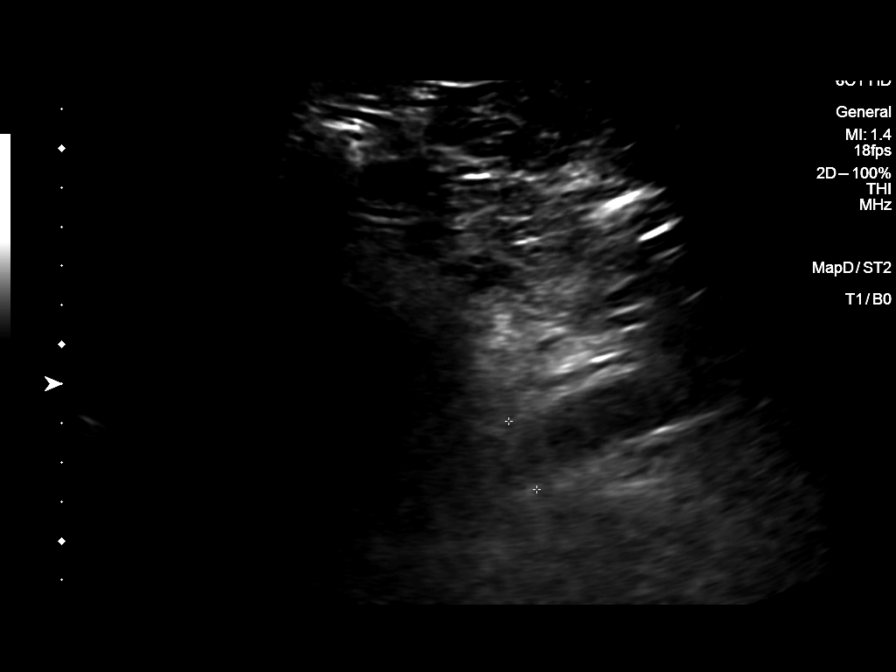
[im 2/8]
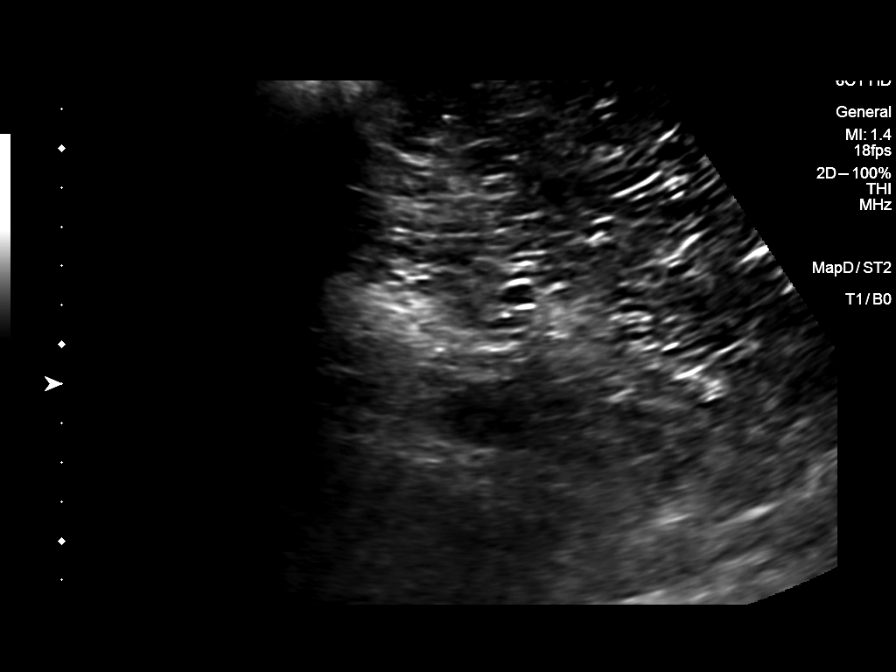
[im 3/8]
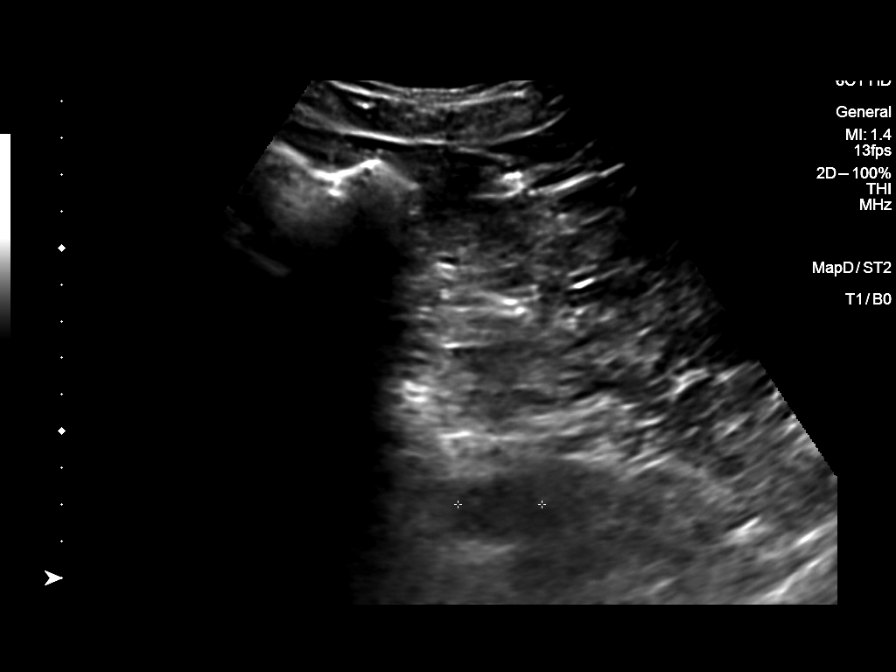
[im 4/8]
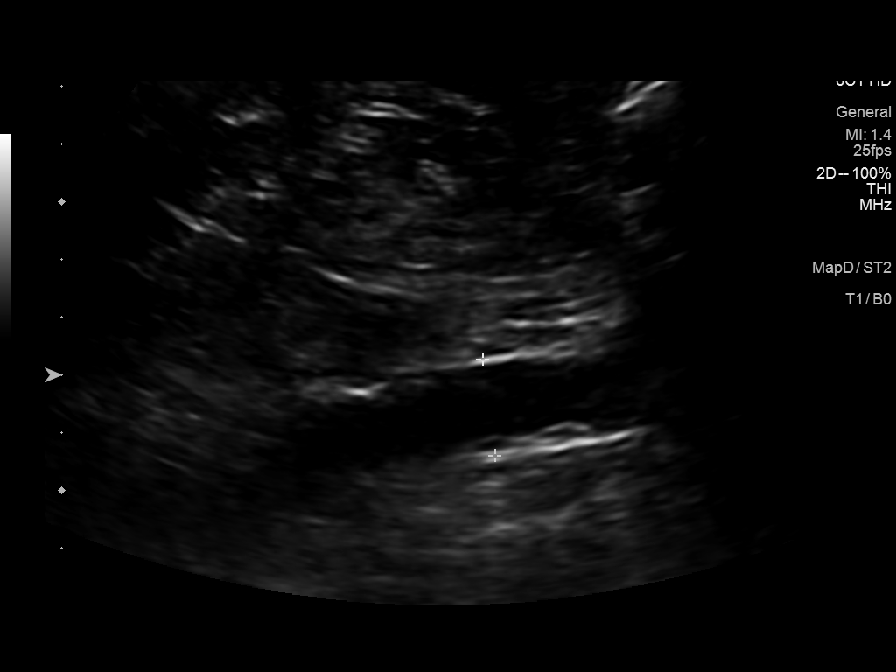
[im 5/8]
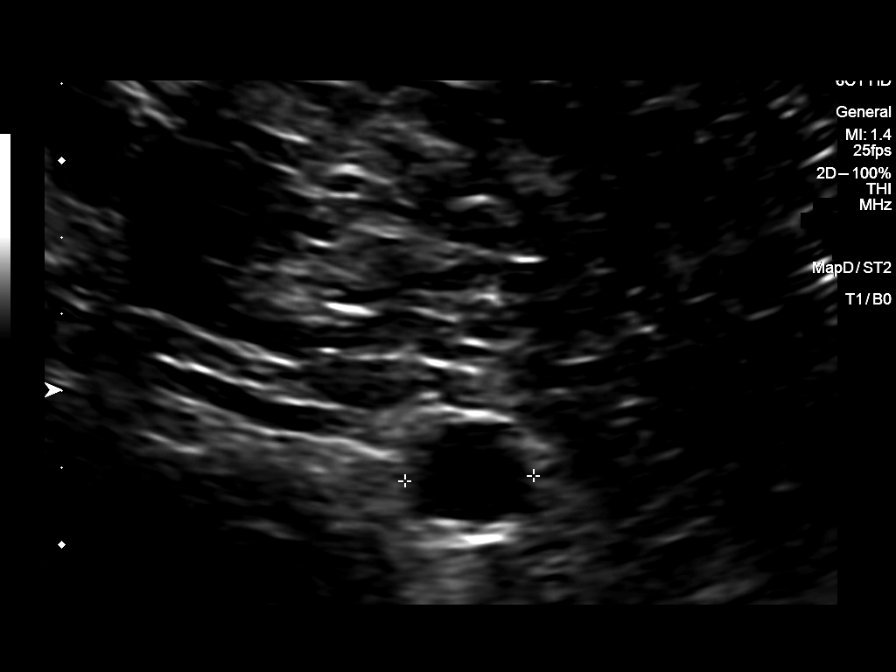
[im 6/8]
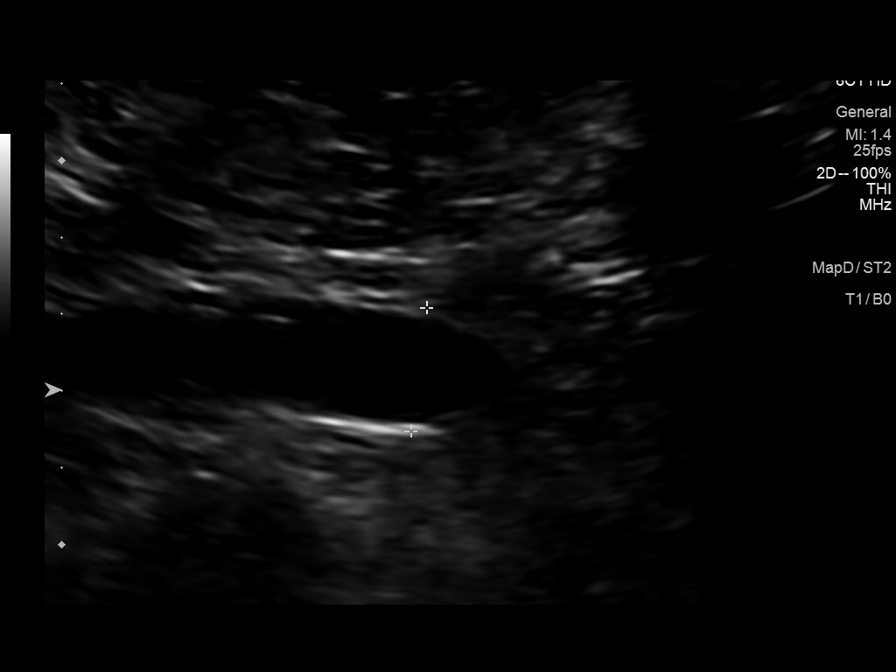
[im 7/8]
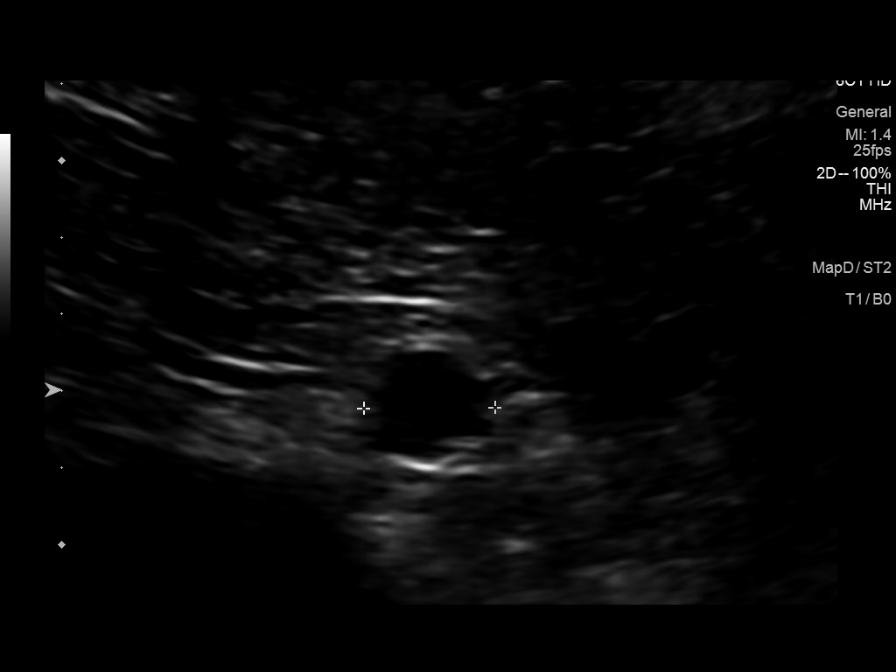
[im 8/8]
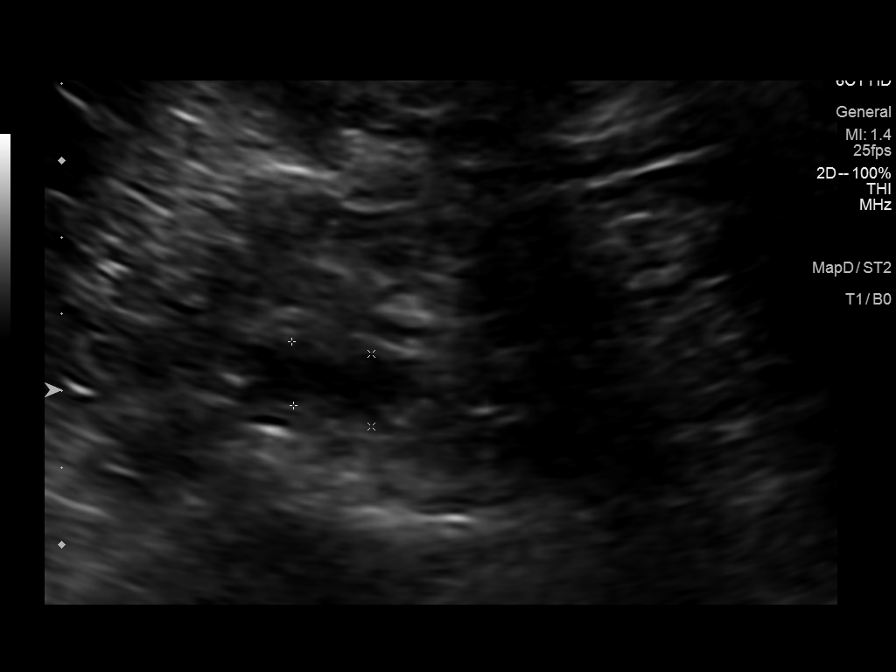

[8 of 8 positions shown; findings below may reference images not displayed]

FINDINGS: Abdominal aortic measurements as follows:

Proximal:  2.3 cm

Mid:  1.7 cm

Distal:  1.7 cm

Mild atherosclerotic plaque noted .
IMPRESSION: Negative for abdominal aortic aneurysm. Mild atherosclerotic plaque
noted.

## 2017-10-07 DIAGNOSIS — J209 Acute bronchitis, unspecified: Secondary | ICD-10-CM | POA: Diagnosis not present

## 2018-01-13 DIAGNOSIS — M5412 Radiculopathy, cervical region: Secondary | ICD-10-CM | POA: Diagnosis not present

## 2018-03-07 DIAGNOSIS — N402 Nodular prostate without lower urinary tract symptoms: Secondary | ICD-10-CM | POA: Diagnosis not present

## 2018-03-07 DIAGNOSIS — I10 Essential (primary) hypertension: Secondary | ICD-10-CM | POA: Diagnosis not present

## 2018-03-07 DIAGNOSIS — Z0000 Encounter for general adult medical examination without abnormal findings: Secondary | ICD-10-CM | POA: Diagnosis not present

## 2018-03-07 DIAGNOSIS — E559 Vitamin D deficiency, unspecified: Secondary | ICD-10-CM | POA: Diagnosis not present

## 2018-03-07 DIAGNOSIS — Z23 Encounter for immunization: Secondary | ICD-10-CM | POA: Diagnosis not present

## 2018-03-07 DIAGNOSIS — I868 Varicose veins of other specified sites: Secondary | ICD-10-CM | POA: Diagnosis not present

## 2018-03-07 DIAGNOSIS — Z125 Encounter for screening for malignant neoplasm of prostate: Secondary | ICD-10-CM | POA: Diagnosis not present

## 2018-03-07 DIAGNOSIS — Z1389 Encounter for screening for other disorder: Secondary | ICD-10-CM | POA: Diagnosis not present

## 2018-03-07 DIAGNOSIS — N183 Chronic kidney disease, stage 3 (moderate): Secondary | ICD-10-CM | POA: Diagnosis not present

## 2018-03-07 DIAGNOSIS — E7800 Pure hypercholesterolemia, unspecified: Secondary | ICD-10-CM | POA: Diagnosis not present

## 2018-03-07 DIAGNOSIS — K219 Gastro-esophageal reflux disease without esophagitis: Secondary | ICD-10-CM | POA: Diagnosis not present

## 2018-03-09 DIAGNOSIS — I868 Varicose veins of other specified sites: Secondary | ICD-10-CM | POA: Diagnosis not present

## 2018-03-09 DIAGNOSIS — N183 Chronic kidney disease, stage 3 (moderate): Secondary | ICD-10-CM | POA: Diagnosis not present

## 2018-03-09 DIAGNOSIS — E7800 Pure hypercholesterolemia, unspecified: Secondary | ICD-10-CM | POA: Diagnosis not present

## 2018-03-09 DIAGNOSIS — E559 Vitamin D deficiency, unspecified: Secondary | ICD-10-CM | POA: Diagnosis not present

## 2018-03-09 DIAGNOSIS — Z125 Encounter for screening for malignant neoplasm of prostate: Secondary | ICD-10-CM | POA: Diagnosis not present

## 2018-03-09 DIAGNOSIS — I10 Essential (primary) hypertension: Secondary | ICD-10-CM | POA: Diagnosis not present

## 2018-03-09 DIAGNOSIS — K219 Gastro-esophageal reflux disease without esophagitis: Secondary | ICD-10-CM | POA: Diagnosis not present

## 2018-03-09 DIAGNOSIS — N402 Nodular prostate without lower urinary tract symptoms: Secondary | ICD-10-CM | POA: Diagnosis not present

## 2018-05-19 DIAGNOSIS — N5201 Erectile dysfunction due to arterial insufficiency: Secondary | ICD-10-CM | POA: Diagnosis not present

## 2018-05-19 DIAGNOSIS — N402 Nodular prostate without lower urinary tract symptoms: Secondary | ICD-10-CM | POA: Diagnosis not present

## 2018-05-29 DIAGNOSIS — N5201 Erectile dysfunction due to arterial insufficiency: Secondary | ICD-10-CM | POA: Diagnosis not present

## 2018-06-11 ENCOUNTER — Emergency Department (HOSPITAL_COMMUNITY): Payer: Medicare Other

## 2018-06-11 ENCOUNTER — Encounter (HOSPITAL_COMMUNITY): Payer: Self-pay | Admitting: Emergency Medicine

## 2018-06-11 ENCOUNTER — Emergency Department (HOSPITAL_COMMUNITY)
Admission: EM | Admit: 2018-06-11 | Discharge: 2018-06-11 | Disposition: A | Discharge status: Home / Self Care | Payer: Medicare Other | Attending: Emergency Medicine | Admitting: Emergency Medicine

## 2018-06-11 ENCOUNTER — Other Ambulatory Visit: Payer: Self-pay

## 2018-06-11 DIAGNOSIS — Z79899 Other long term (current) drug therapy: Secondary | ICD-10-CM | POA: Diagnosis not present

## 2018-06-11 DIAGNOSIS — R0789 Other chest pain: Secondary | ICD-10-CM | POA: Diagnosis not present

## 2018-06-11 DIAGNOSIS — N183 Chronic kidney disease, stage 3 (moderate): Secondary | ICD-10-CM | POA: Insufficient documentation

## 2018-06-11 DIAGNOSIS — I129 Hypertensive chronic kidney disease with stage 1 through stage 4 chronic kidney disease, or unspecified chronic kidney disease: Secondary | ICD-10-CM | POA: Diagnosis not present

## 2018-06-11 DIAGNOSIS — Q602 Renal agenesis, unspecified: Secondary | ICD-10-CM

## 2018-06-11 DIAGNOSIS — Z87891 Personal history of nicotine dependence: Secondary | ICD-10-CM | POA: Insufficient documentation

## 2018-06-11 DIAGNOSIS — R079 Chest pain, unspecified: Secondary | ICD-10-CM | POA: Diagnosis not present

## 2018-06-11 LAB — CBC
HEMATOCRIT: 43.9 % (ref 39.0–52.0)
Hemoglobin: 14.3 g/dL (ref 13.0–17.0)
MCH: 30.6 pg (ref 26.0–34.0)
MCHC: 32.6 g/dL (ref 30.0–36.0)
MCV: 94 fL (ref 78.0–100.0)
Platelets: 266 10*3/uL (ref 150–400)
RBC: 4.67 MIL/uL (ref 4.22–5.81)
RDW: 12.3 % (ref 11.5–15.5)
WBC: 6.7 10*3/uL (ref 4.0–10.5)

## 2018-06-11 LAB — BASIC METABOLIC PANEL
Anion gap: 9 (ref 5–15)
BUN: 17 mg/dL (ref 8–23)
CHLORIDE: 103 mmol/L (ref 98–111)
CO2: 27 mmol/L (ref 22–32)
Calcium: 9.4 mg/dL (ref 8.9–10.3)
Creatinine, Ser: 1.79 mg/dL — ABNORMAL HIGH (ref 0.61–1.24)
GFR calc Af Amer: 44 mL/min — ABNORMAL LOW (ref >60)
GFR calc non Af Amer: 38 mL/min — ABNORMAL LOW (ref >60)
GLUCOSE: 101 mg/dL — AB (ref 70–99)
POTASSIUM: 4.6 mmol/L (ref 3.5–5.1)
Sodium: 139 mmol/L (ref 135–145)

## 2018-06-11 LAB — I-STAT TROPONIN, ED
Troponin i, poc: 0.03 ng/mL (ref 0.00–0.08)
Troponin i, poc: 0.04 ng/mL (ref 0.00–0.08)

## 2018-06-11 NOTE — ED Notes (Signed)
Patient transported to X-ray 

## 2018-06-11 NOTE — ED Provider Notes (Signed)
[facility] EMERGENCY DEPARTMENT Provider Note   CSN: [id number] Arrival date & time: 06/11/18  1134     History   Chief Complaint Chief Complaint  Patient presents with  . Chest Pain    HPI Carlos Young is a 67 y.o. male.  HPI Patient presents to the emergency department with chest pain that is been ongoing for he states quite a while but this morning had a episode of chest pain that was 4 out of 10 seems to involve the bilateral chest area and the radiates mostly to the right from the midsternal area.  Patient states that he did not take any medications prior to arrival.  He states that nothing seems to make the condition better or worse.  He states there is no worsening or alleviating factors.  The patient denies  shortness of breath, headache,blurred vision, neck pain, fever, cough, weakness, numbness, dizziness, anorexia, edema, abdominal pain, nausea, vomiting, diarrhea, rash, back pain, dysuria, hematemesis, bloody stool, near syncope, or syncope. Past Medical History:  Diagnosis Date  . Chronic kidney disease, stage III (moderate) (HCC)   . GERD (gastroesophageal reflux disease)   . Hyperlipidemia   . Hypertension   . Inguinal hernia   . Low back pain   . Myalgia and myositis, unspecified   . Sleep disturbance, unspecified   . Unspecified vitamin D deficiency     Patient Active Problem List   Diagnosis Date Noted  . Right inguinal hernia 10/31/2012    Past Surgical History:  Procedure Laterality Date  . HERNIA REPAIR  2008 est   ing. hernia        Home Medications    Prior to Admission medications   Medication Sig Start Date End Date Taking? Authorizing Provider  amLODipine (NORVASC) 2.5 MG tablet Take 2.5 mg by mouth daily.   Yes [provider]  atorvastatin (LIPITOR) 20 MG tablet Take 20 mg by mouth daily.   Yes [provider]  cholecalciferol (VITAMIN D) 1000 UNITS tablet Take 1,000 Units by mouth daily.   Yes  [provider]  ibuprofen (ADVIL,MOTRIN) 200 MG tablet Take 400 mg by mouth every 6 (six) hours as needed for mild pain.   Yes [provider]  losartan (COZAAR) 50 MG tablet Take 50 mg by mouth daily.   Yes [provider]  omeprazole (PRILOSEC) 40 MG capsule Take 40 mg by mouth daily.   Yes [provider]  vitamin B-12 (CYANOCOBALAMIN) 500 MCG tablet Take 500 mcg by mouth daily.   Yes [provider]  HYDROcodone-acetaminophen (NORCO/VICODIN) 5-325 MG per tablet Take 1-2 tablets by mouth every 6 (six) hours as needed for moderate pain. Patient not taking: Reported on 06/11/2018 09/30/14   Cathren Laine, MD  ondansetron (ZOFRAN ODT) 8 MG disintegrating tablet Take 1 tablet (8 mg total) by mouth every 8 (eight) hours as needed for nausea or vomiting. Patient not taking: Reported on 06/11/2018 09/30/14   Cathren Laine, MD    Family History Family History  Problem Relation Age of Onset  . Cancer Mother        lung ca  . Stroke Father   . Cancer Sister        breast ca  late 108s   . Stroke Sister   . Arthritis Sister     Social History Social History   Tobacco Use  . Smoking status: Former Smoker    Last attempt to quit: 10/26/1992    Years since quitting:  25.6  . Smokeless tobacco: Never Used  Substance Use Topics  . Alcohol use: Yes    Alcohol/week: 1.0 standard drinks    Types: 1 Cans of beer per week    Comment: per week or less  . Drug use: No     Allergies   Sulfa antibiotics; Ace inhibitors; and Penicillins   Review of Systems Review of Systems All other systems negative except as documented in the HPI. All pertinent positives and negatives as reviewed in the HPI. Physical Exam Updated Vital Signs BP 131/69   Pulse 66   Temp 99.4 F (37.4 C) (Oral)   Resp 14   Ht 5\' 9"  (1.753 m)   Wt 86.2 kg   SpO2 96%   BMI 28.06 kg/m   Physical Exam  Constitutional: He is oriented to person, place, and time. He appears  well-developed and well-nourished. No distress.  HENT:  Head: Normocephalic and atraumatic.  Mouth/Throat: Oropharynx is clear and moist.  Eyes: Pupils are equal, round, and reactive to light.  Neck: Normal range of motion. Neck supple.  Cardiovascular: Normal rate and normal heart sounds. A regularly irregular rhythm present. Exam reveals no gallop and no friction rub.  No murmur heard. Pulmonary/Chest: Effort normal and breath sounds normal. No respiratory distress. He has no wheezes.  Abdominal: Soft. Bowel sounds are normal. He exhibits no distension. There is no tenderness.  Neurological: He is alert and oriented to person, place, and time. He exhibits normal muscle tone. Coordination normal.  Skin: Skin is warm and dry. Capillary refill takes less than 2 seconds. No rash noted. No erythema.  Psychiatric: He has a normal mood and affect. His behavior is normal.  Nursing note and vitals reviewed.    ED Treatments / Results  Labs (all labs ordered are listed, but only abnormal results are displayed) Labs Reviewed  BASIC METABOLIC PANEL - Abnormal; Notable for the following components:      Result Value   Glucose, Bld 101 (*)    Creatinine, Ser 1.79 (*)    GFR calc non Af Amer 38 (*)    GFR calc Af Amer 44 (*)    All other components within normal limits  CBC  I-STAT TROPONIN, ED  I-STAT TROPONIN, ED    EKG EKG Interpretation  Date/Time:  Sunday June 11 2018 11:39:27 EDT Ventricular Rate:  84 PR Interval:  166 QRS Duration: 86 QT Interval:  324 QTC Calculation: 382 R Axis:   -16 Text Interpretation:  Normal sinus rhythm Nonspecific T wave abnormality Abnormal ECG bipasic t waves seen on piror flipped t wave in lead III seen on prior No significant change since last tracing Confirmed by Melene Plan [phone]) on 06/11/2018 12:16:31 PM   Radiology Dg Chest 2 View  Result Date: 06/11/2018 CLINICAL DATA:  Three-day history of mid chest pain that became acutely worse this  morning. Current history of hypertension. EXAM: CHEST - 2 VIEW COMPARISON:  CT chest 02/09/2011. CTA chest 11/22/2004. No prior chest x-ray. FINDINGS: Cardiac silhouette normal in size. Thoracic aorta mildly tortuous and atherosclerotic. Moderate-sized hiatal hernia. Hilar and mediastinal contours otherwise unremarkable. Chronic scarring involving the LEFT LOWER LOBE and lingula, unchanged. Lungs otherwise clear. Bronchovascular markings normal. Pulmonary vascularity normal. No pleural effusions. Degenerative changes and DISH involving the thoracic spine. IMPRESSION: 1.  No acute cardiopulmonary disease. 2. Stable chronic scarring involving the LEFT LOWER LOBE and lingula. 3. Moderate-sized hiatal hernia. Electronically Signed   By: Hulan Saas M.D.   On:  06/11/2018 12:57    Procedures Procedures (including critical care time)  Medications Ordered in ED Medications - No data to display   Initial Impression / Assessment and Plan / ED Course  I have reviewed the triage vital signs and the nursing notes.  Pertinent labs & imaging results that were available during my care of the patient were reviewed by me and considered in my medical decision making (see chart for details).     Patient will be advised to follow-up with Dr. Donnie Aho his cardiologist for a recheck.  Patient states that he is not feeling any symptoms at this time.  He states that he will call his primary doctor as well for recheck.  Advised the patient to return for any worsening in his condition.  It seems that this chest pain would be atypical for cardiac chest pain due to the fact that it is more right-sided pain but this cannot be totally eliminated as a cause.  We did have 2 sets of negative troponins which are reassuring.  Final Clinical Impressions(s) / ED Diagnoses   Final diagnoses:  None    ED Discharge Orders    None       Charlestine Night, PA-C 06/11/18 1549    Melene Plan, DO 06/11/18 1550

## 2018-06-11 NOTE — Discharge Instructions (Addendum)
Call Dr. York Spaniel office for recheck appointment.  Return here for any worsening in your condition.  He can also follow-up with your primary doctor.

## 2018-06-11 NOTE — ED Triage Notes (Signed)
Pt reports CP onset this AM. Dull/aching, central/non radiating, 4/10. Pt states this pain has been present for several months but has worsened since Thursday.

## 2018-06-13 DIAGNOSIS — N183 Chronic kidney disease, stage 3 (moderate): Secondary | ICD-10-CM | POA: Diagnosis not present

## 2018-06-13 DIAGNOSIS — I10 Essential (primary) hypertension: Secondary | ICD-10-CM | POA: Diagnosis not present

## 2018-06-13 DIAGNOSIS — R079 Chest pain, unspecified: Secondary | ICD-10-CM | POA: Diagnosis not present

## 2018-06-13 DIAGNOSIS — R9431 Abnormal electrocardiogram [ECG] [EKG]: Secondary | ICD-10-CM | POA: Diagnosis not present

## 2018-06-13 DIAGNOSIS — R0789 Other chest pain: Secondary | ICD-10-CM | POA: Diagnosis not present

## 2018-06-13 DIAGNOSIS — Q602 Renal agenesis, unspecified: Secondary | ICD-10-CM

## 2018-06-16 DIAGNOSIS — R9431 Abnormal electrocardiogram [ECG] [EKG]: Secondary | ICD-10-CM | POA: Diagnosis not present

## 2018-06-30 DIAGNOSIS — R109 Unspecified abdominal pain: Secondary | ICD-10-CM | POA: Diagnosis not present

## 2018-07-06 ENCOUNTER — Other Ambulatory Visit: Payer: Self-pay | Admitting: Obstetrics and Gynecology

## 2018-07-06 DIAGNOSIS — R1010 Upper abdominal pain, unspecified: Secondary | ICD-10-CM

## 2018-07-18 DIAGNOSIS — Z23 Encounter for immunization: Secondary | ICD-10-CM | POA: Diagnosis not present

## 2018-07-21 ENCOUNTER — Ambulatory Visit
Admission: RE | Admit: 2018-07-21 | Discharge: 2018-07-21 | Disposition: A | Discharge status: Home / Self Care | Payer: Medicare Other | Source: Ambulatory Visit | Attending: Family Medicine | Admitting: Family Medicine

## 2018-07-21 DIAGNOSIS — R1010 Upper abdominal pain, unspecified: Secondary | ICD-10-CM

## 2018-07-21 DIAGNOSIS — R1013 Epigastric pain: Secondary | ICD-10-CM | POA: Diagnosis not present

## 2018-11-01 DIAGNOSIS — K449 Diaphragmatic hernia without obstruction or gangrene: Secondary | ICD-10-CM | POA: Diagnosis not present

## 2018-11-01 DIAGNOSIS — R0600 Dyspnea, unspecified: Secondary | ICD-10-CM | POA: Diagnosis not present

## 2018-11-01 DIAGNOSIS — R079 Chest pain, unspecified: Secondary | ICD-10-CM | POA: Diagnosis not present

## 2018-12-04 DIAGNOSIS — R0789 Other chest pain: Secondary | ICD-10-CM | POA: Diagnosis not present

## 2019-01-31 DIAGNOSIS — I10 Essential (primary) hypertension: Secondary | ICD-10-CM | POA: Diagnosis not present

## 2019-01-31 DIAGNOSIS — R490 Dysphonia: Secondary | ICD-10-CM | POA: Diagnosis not present

## 2019-01-31 DIAGNOSIS — J029 Acute pharyngitis, unspecified: Secondary | ICD-10-CM | POA: Diagnosis not present

## 2019-02-21 DIAGNOSIS — R490 Dysphonia: Secondary | ICD-10-CM | POA: Diagnosis not present

## 2019-03-28 DIAGNOSIS — L237 Allergic contact dermatitis due to plants, except food: Secondary | ICD-10-CM | POA: Diagnosis not present

## 2019-03-29 DIAGNOSIS — K449 Diaphragmatic hernia without obstruction or gangrene: Secondary | ICD-10-CM | POA: Diagnosis not present

## 2019-03-29 DIAGNOSIS — K317 Polyp of stomach and duodenum: Secondary | ICD-10-CM | POA: Diagnosis not present

## 2019-03-29 DIAGNOSIS — K2930 Chronic superficial gastritis without bleeding: Secondary | ICD-10-CM | POA: Diagnosis not present

## 2019-03-29 DIAGNOSIS — K3189 Other diseases of stomach and duodenum: Secondary | ICD-10-CM | POA: Diagnosis not present

## 2019-03-29 DIAGNOSIS — R0789 Other chest pain: Secondary | ICD-10-CM | POA: Diagnosis not present

## 2019-04-05 DIAGNOSIS — K2930 Chronic superficial gastritis without bleeding: Secondary | ICD-10-CM | POA: Diagnosis not present

## 2019-04-05 DIAGNOSIS — K317 Polyp of stomach and duodenum: Secondary | ICD-10-CM | POA: Diagnosis not present

## 2019-04-27 DIAGNOSIS — M7989 Other specified soft tissue disorders: Secondary | ICD-10-CM | POA: Diagnosis not present

## 2019-04-27 DIAGNOSIS — N183 Chronic kidney disease, stage 3 (moderate): Secondary | ICD-10-CM | POA: Diagnosis not present

## 2019-04-27 DIAGNOSIS — I10 Essential (primary) hypertension: Secondary | ICD-10-CM | POA: Diagnosis not present

## 2019-04-27 DIAGNOSIS — M25571 Pain in right ankle and joints of right foot: Secondary | ICD-10-CM | POA: Diagnosis not present

## 2019-04-29 ENCOUNTER — Other Ambulatory Visit: Payer: Self-pay

## 2019-04-29 ENCOUNTER — Emergency Department (HOSPITAL_BASED_OUTPATIENT_CLINIC_OR_DEPARTMENT_OTHER): Payer: Medicare Other

## 2019-04-29 ENCOUNTER — Encounter (HOSPITAL_COMMUNITY): Payer: Self-pay | Admitting: Emergency Medicine

## 2019-04-29 ENCOUNTER — Emergency Department (HOSPITAL_COMMUNITY)
Admission: EM | Admit: 2019-04-29 | Discharge: 2019-04-29 | Disposition: A | Discharge status: Home / Self Care | Payer: Medicare Other | Attending: Emergency Medicine | Admitting: Emergency Medicine

## 2019-04-29 DIAGNOSIS — Z79899 Other long term (current) drug therapy: Secondary | ICD-10-CM | POA: Insufficient documentation

## 2019-04-29 DIAGNOSIS — I129 Hypertensive chronic kidney disease with stage 1 through stage 4 chronic kidney disease, or unspecified chronic kidney disease: Secondary | ICD-10-CM | POA: Diagnosis not present

## 2019-04-29 DIAGNOSIS — R609 Edema, unspecified: Secondary | ICD-10-CM

## 2019-04-29 DIAGNOSIS — M79661 Pain in right lower leg: Secondary | ICD-10-CM | POA: Diagnosis not present

## 2019-04-29 DIAGNOSIS — Z87891 Personal history of nicotine dependence: Secondary | ICD-10-CM | POA: Diagnosis not present

## 2019-04-29 DIAGNOSIS — M79604 Pain in right leg: Secondary | ICD-10-CM | POA: Diagnosis not present

## 2019-04-29 DIAGNOSIS — N183 Chronic kidney disease, stage 3 (moderate): Secondary | ICD-10-CM | POA: Insufficient documentation

## 2019-04-29 LAB — CBC WITH DIFFERENTIAL/PLATELET
Abs Immature Granulocytes: 0.09 10*3/uL — ABNORMAL HIGH (ref 0.00–0.07)
Basophils Absolute: 0 10*3/uL (ref 0.0–0.1)
Basophils Relative: 0 %
Eosinophils Absolute: 0 10*3/uL (ref 0.0–0.5)
Eosinophils Relative: 0 %
HCT: 42.2 % (ref 39.0–52.0)
Hemoglobin: 14.2 g/dL (ref 13.0–17.0)
Immature Granulocytes: 1 %
Lymphocytes Relative: 4 %
Lymphs Abs: 0.7 10*3/uL (ref 0.7–4.0)
MCH: 30.8 pg (ref 26.0–34.0)
MCHC: 33.6 g/dL (ref 30.0–36.0)
MCV: 91.5 fL (ref 80.0–100.0)
Monocytes Absolute: 0.6 10*3/uL (ref 0.1–1.0)
Monocytes Relative: 4 %
Neutro Abs: 14.5 10*3/uL — ABNORMAL HIGH (ref 1.7–7.7)
Neutrophils Relative %: 91 %
Platelets: 269 10*3/uL (ref 150–400)
RBC: 4.61 MIL/uL (ref 4.22–5.81)
RDW: 12.8 % (ref 11.5–15.5)
WBC: 15.9 10*3/uL — ABNORMAL HIGH (ref 4.0–10.5)
nRBC: 0 % (ref 0.0–0.2)

## 2019-04-29 LAB — BASIC METABOLIC PANEL
Anion gap: 11 (ref 5–15)
BUN: 21 mg/dL (ref 8–23)
CO2: 22 mmol/L (ref 22–32)
Calcium: 9.7 mg/dL (ref 8.9–10.3)
Chloride: 102 mmol/L (ref 98–111)
Creatinine, Ser: 1.89 mg/dL — ABNORMAL HIGH (ref 0.61–1.24)
GFR calc Af Amer: 42 mL/min — ABNORMAL LOW (ref >60)
GFR calc non Af Amer: 36 mL/min — ABNORMAL LOW (ref >60)
Glucose, Bld: 207 mg/dL — ABNORMAL HIGH (ref 70–99)
Potassium: 4.2 mmol/L (ref 3.5–5.1)
Sodium: 135 mmol/L (ref 135–145)

## 2019-04-29 NOTE — ED Triage Notes (Signed)
C/o right leg pain, started 1 week ago, saw a PA at [place] this week for same. Concerned now that it may be a blood clot--  Started on prednisone and given an Rx for a diuretic but did not start that yet.  Pain in right calf, none with dorsiflexion.

## 2019-04-29 NOTE — Discharge Instructions (Addendum)
Your ultrasound was normal today. Elevate your leg to help with swelling. Continue treating your symptoms with over-the-counter medications. Follow-up with your primary care provider regarding your visit today. Return to the ER if you develop fever, redness to your leg, or new or concerning symptoms.

## 2019-04-29 NOTE — ED Provider Notes (Signed)
[facility] Note   CSN: [id number] Arrival date & time: 04/29/19  1127    History   Chief Complaint Chief Complaint  Patient presents with  . Leg Pain    HPI Carlos Young is a [age] y.o. male with past medical history of CKD, GERD, hypertension, presenting to the emergency department with complaint of 1 week of right-sided lower leg pain.  Patient states his pain began last Friday in the lateral aspect of his ankle.  He had some mild associated swelling.  He states on Friday he developed pain in his right calf as well.  Pain is not aggravated by any movements or walking.  He was seen by his PCP who prescribed prednisone as well as a diuretic to treat possible gout and swelling.  He states he has not taken the diuretic yet due to concern for side effects, however he has been taking the prednisone.  He has no history of DVT.  No recent surgery, no history of cancer.  No known injuries.     The history is provided by the patient.    Past Medical History:  Diagnosis Date  . Chronic kidney disease, stage III (moderate) (HCC)   . GERD (gastroesophageal reflux disease)   . Hyperlipidemia   . Hypertension   . Inguinal hernia   . Low back pain   . Myalgia and myositis, unspecified   . Sleep disturbance, unspecified   . Unspecified vitamin D deficiency     Patient Active Problem List   Diagnosis Date Noted  . Congenital absence of kidney 06/13/2018    Past Surgical History:  Procedure Laterality Date  . HERNIA REPAIR  2008 est   ing. hernia        Home Medications    Prior to Admission medications   Medication Sig Start Date End Date Taking? Authorizing Provider  amLODipine (NORVASC) 2.5 MG tablet Take 2.5 mg by mouth daily.    [provider]  atorvastatin (LIPITOR) 20 MG tablet Take 20 mg by mouth daily.    [provider]  cholecalciferol (VITAMIN D) 1000 UNITS tablet Take 1,000 Units by mouth daily.     [provider]  HYDROcodone-acetaminophen (NORCO/VICODIN) 5-325 MG per tablet Take 1-2 tablets by mouth every 6 (six) hours as needed for moderate pain. Patient not taking: Reported on 06/11/2018 09/30/14   Cathren LaineSteinl, Kevin, MD  ibuprofen (ADVIL,MOTRIN) 200 MG tablet Take 400 mg by mouth every 6 (six) hours as needed for mild pain.    [provider]  losartan (COZAAR) 50 MG tablet Take 50 mg by mouth daily.    [provider]  omeprazole (PRILOSEC) 40 MG capsule Take 40 mg by mouth daily.    [provider]  ondansetron (ZOFRAN ODT) 8 MG disintegrating tablet Take 1 tablet (8 mg total) by mouth every 8 (eight) hours as needed for nausea or vomiting. Patient not taking: Reported on 06/11/2018 09/30/14   Cathren LaineSteinl, Kevin, MD  vitamin B-12 (CYANOCOBALAMIN) 500 MCG tablet Take 500 mcg by mouth daily.    [provider]    Family History Family History  Problem Relation Age of Onset  . Cancer Mother        lung ca  . Stroke Father   . Cancer Sister        breast ca  late 2620s   . Stroke Sister   . Arthritis Sister     Social History Social History   Tobacco  Use  . Smoking status: Former Smoker    Quit date: 10/26/1992    Years since quitting: 26.5  . Smokeless tobacco: Never Used  Substance Use Topics  . Alcohol use: Yes    Alcohol/week: 1.0 standard drinks    Types: 1 Cans of beer per week    Comment: per week or less  . Drug use: No     Allergies   Sulfa antibiotics, Ace inhibitors, and Penicillins   Review of Systems Review of Systems  All other systems reviewed and are negative.    Physical Exam Updated Vital Signs BP 129/76 (BP Location: Right Arm)   Pulse 66   Temp 98.6 F (37 C) (Oral)   Resp 16   Ht 5\' 9"  (1.753 m)   Wt 91.6 kg   SpO2 95%   BMI 29.83 kg/m   Physical Exam Vitals signs and nursing note reviewed.  Constitutional:      General: He is not in acute distress.    Appearance: He is well-developed.  HENT:      Head: Normocephalic and atraumatic.  Eyes:     Conjunctiva/sclera: Conjunctivae normal.  Cardiovascular:     Rate and Rhythm: Normal rate and regular rhythm.  Pulmonary:     Effort: Pulmonary effort is normal.     Breath sounds: Normal breath sounds.  Abdominal:     Palpations: Abdomen is soft.  Musculoskeletal:     Comments: Slight swelling noted to right lateral ankle. NO redness or warmth. No tenderness. No calf tenderness, neg homans sign. Intact DP pulses b.l. normal sensation.  Varicosity presenting mid anteromedial calf, nontender  Skin:    General: Skin is warm.  Neurological:     Mental Status: He is alert.  Psychiatric:        Behavior: Behavior normal.      ED Treatments / Results  Labs (all labs ordered are listed, but only abnormal results are displayed) Labs Reviewed  BASIC METABOLIC PANEL - Abnormal; Notable for the following components:      Result Value   Glucose, Bld 207 (*)    Creatinine, Ser 1.89 (*)    GFR calc non Af Amer 36 (*)    GFR calc Af Amer 42 (*)    All other components within normal limits  CBC WITH DIFFERENTIAL/PLATELET - Abnormal; Notable for the following components:   WBC 15.9 (*)    Neutro Abs 14.5 (*)    Abs Immature Granulocytes 0.09 (*)    All other components within normal limits    EKG None  Radiology Vas [place] Lower Extremity Venous (dvt) (only [facility])  Result Date: 04/29/2019  Lower Venous Study Indications: Edema.  Comparison Study: No prior study on file for comparison. Performing Technologist: Sherren Kernsandace Kanady RVS  Examination Guidelines: A complete evaluation includes B-mode imaging, spectral Doppler, color Doppler, and power Doppler as needed of all accessible portions of each vessel. Bilateral testing is considered an integral part of a complete examination. Limited examinations for reoccurring indications may be performed as noted.  +---------+---------------+---------+-----------+----------+-------+ RIGHT     CompressibilityPhasicitySpontaneityPropertiesSummary +---------+---------------+---------+-----------+----------+-------+ CFV      Full           Yes      Yes                          +---------+---------------+---------+-----------+----------+-------+ SFJ      Full                                                 +---------+---------------+---------+-----------+----------+-------+  FV Prox  Full                                                 +---------+---------------+---------+-----------+----------+-------+ FV Mid   Full                                                 +---------+---------------+---------+-----------+----------+-------+ FV DistalFull                                                 +---------+---------------+---------+-----------+----------+-------+ PFV      Full                                                 +---------+---------------+---------+-----------+----------+-------+ POP      Full           Yes      Yes                          +---------+---------------+---------+-----------+----------+-------+ PTV      Full                                                 +---------+---------------+---------+-----------+----------+-------+ PERO     Full                                                 +---------+---------------+---------+-----------+----------+-------+ GSV      Full                                                 +---------+---------------+---------+-----------+----------+-------+   +----+---------------+---------+-----------+----------+-------+ LEFTCompressibilityPhasicitySpontaneityPropertiesSummary +----+---------------+---------+-----------+----------+-------+ CFV Full           Yes      Yes                          +----+---------------+---------+-----------+----------+-------+     Summary: Right: There is no evidence of deep vein thrombosis in the lower extremity.There is no evidence of  superficial venous thrombosis. Left: No evidence of common femoral vein obstruction.  *See table(s) above for measurements and observations.    Preliminary     Procedures Procedures (including critical care time)  Medications Ordered in ED Medications - No data to display   Initial Impression / Assessment and Plan / ED Course  I have reviewed the triage vital signs and the nursing notes.  Pertinent labs & imaging results that were available during my care of the patient were reviewed by me and considered in my medical decision making (see chart for  details).  Clinical Course as of Apr 29 1551  Sun Apr 29, 2019  1236 EKG is normal sinus rhythm rate of 88, nonspecific ST-T's.   [MB]  1304 Pt recently took a course of steroids  WBC(!): 15.9 [JR]  1347 Per vascular tech, DVT study is negative   [JR]  [id number] 68 year old male here for evaluation of right leg swelling and pain that is been going on.  His PCP had trialed him on some prednisone and also a diuretic.  He does not have any significant swelling.  No appreciable cords.  He had some blood work here that showed a slight rise in his creatinine and elevated WBCs but he is on prednisone.  Duplex does not show any obvious clot.  Reviewed with patient he will follow-up with his PCP for further evaluation.   [MB]    Clinical Course User Index [JR] Robinson, [place] N, PA-C [MB] Terrilee FilesButler, Michael C, MD       Patient presenting with 1 week of right leg pain extending up into his calf, concerned for blood clot.  Treated by PCP with steroid for possible gout.  History of DVT.  Exam is overall reassuring, no signs of cellulitis.  Venous study is negative for DVT.  Discussed symptomatic management and follow-up with PCP.  Strict return precautions.  Patient agreeable to plan and stable discharge.  Discussed results, findings, treatment and follow up. Patient advised of return precautions. Patient verbalized understanding and agreed with plan.   Final Clinical Impressions(s) / ED Diagnoses   Final diagnoses:  Right leg pain    ED Discharge Orders    None       Robinson, [place] N, PA-C 04/29/19 1552    Terrilee FilesButler, Michael C, MD 04/29/19 1736

## 2019-04-29 NOTE — Progress Notes (Signed)
VASCULAR LAB PRELIMINARY  PRELIMINARY  PRELIMINARY  PRELIMINARY  Right lower extremity venous duplex completed.    Preliminary report:  See CV proc for preliminary results.  Gave report to [place] Robinson, PA-C  Cyanne Delmar, [facility], RVT 04/29/2019, 1:27 PM

## 2019-04-29 NOTE — ED Notes (Signed)
Patient verbalizes understanding of discharge instructions. Opportunity for questioning and answers were provided. Armband removed by staff, pt discharged from ED.  

## 2019-06-17 DIAGNOSIS — H2513 Age-related nuclear cataract, bilateral: Secondary | ICD-10-CM | POA: Diagnosis not present

## 2019-07-06 DIAGNOSIS — R05 Cough: Secondary | ICD-10-CM | POA: Diagnosis not present

## 2019-07-06 DIAGNOSIS — R0602 Shortness of breath: Secondary | ICD-10-CM | POA: Diagnosis not present

## 2019-07-31 DIAGNOSIS — Z23 Encounter for immunization: Secondary | ICD-10-CM | POA: Diagnosis not present

## 2019-08-02 DIAGNOSIS — R0602 Shortness of breath: Secondary | ICD-10-CM | POA: Diagnosis not present

## 2019-08-14 DIAGNOSIS — K449 Diaphragmatic hernia without obstruction or gangrene: Secondary | ICD-10-CM | POA: Diagnosis not present

## 2019-08-29 DIAGNOSIS — I10 Essential (primary) hypertension: Secondary | ICD-10-CM | POA: Diagnosis not present

## 2019-08-29 DIAGNOSIS — N402 Nodular prostate without lower urinary tract symptoms: Secondary | ICD-10-CM | POA: Diagnosis not present

## 2019-08-29 DIAGNOSIS — E7800 Pure hypercholesterolemia, unspecified: Secondary | ICD-10-CM | POA: Diagnosis not present

## 2019-10-24 DIAGNOSIS — I10 Essential (primary) hypertension: Secondary | ICD-10-CM | POA: Diagnosis not present

## 2019-10-24 DIAGNOSIS — E7800 Pure hypercholesterolemia, unspecified: Secondary | ICD-10-CM | POA: Diagnosis not present

## 2019-10-24 DIAGNOSIS — N402 Nodular prostate without lower urinary tract symptoms: Secondary | ICD-10-CM | POA: Diagnosis not present

## 2019-11-26 DIAGNOSIS — Z125 Encounter for screening for malignant neoplasm of prostate: Secondary | ICD-10-CM | POA: Diagnosis not present

## 2019-11-26 DIAGNOSIS — E7800 Pure hypercholesterolemia, unspecified: Secondary | ICD-10-CM | POA: Diagnosis not present

## 2019-11-26 DIAGNOSIS — N183 Chronic kidney disease, stage 3 unspecified: Secondary | ICD-10-CM | POA: Diagnosis not present

## 2019-11-26 DIAGNOSIS — N529 Male erectile dysfunction, unspecified: Secondary | ICD-10-CM | POA: Diagnosis not present

## 2019-11-26 DIAGNOSIS — K219 Gastro-esophageal reflux disease without esophagitis: Secondary | ICD-10-CM | POA: Diagnosis not present

## 2019-11-26 DIAGNOSIS — M545 Low back pain: Secondary | ICD-10-CM | POA: Diagnosis not present

## 2019-11-26 DIAGNOSIS — E559 Vitamin D deficiency, unspecified: Secondary | ICD-10-CM | POA: Diagnosis not present

## 2019-11-26 DIAGNOSIS — I10 Essential (primary) hypertension: Secondary | ICD-10-CM | POA: Diagnosis not present

## 2019-11-26 DIAGNOSIS — Z0000 Encounter for general adult medical examination without abnormal findings: Secondary | ICD-10-CM | POA: Diagnosis not present

## 2019-12-28 DIAGNOSIS — N183 Chronic kidney disease, stage 3 unspecified: Secondary | ICD-10-CM | POA: Diagnosis not present

## 2019-12-28 DIAGNOSIS — I10 Essential (primary) hypertension: Secondary | ICD-10-CM | POA: Diagnosis not present

## 2019-12-28 DIAGNOSIS — E7800 Pure hypercholesterolemia, unspecified: Secondary | ICD-10-CM | POA: Diagnosis not present

## 2019-12-28 DIAGNOSIS — N402 Nodular prostate without lower urinary tract symptoms: Secondary | ICD-10-CM | POA: Diagnosis not present

## 2020-05-13 DIAGNOSIS — R42 Dizziness and giddiness: Secondary | ICD-10-CM | POA: Diagnosis not present

## 2020-05-13 DIAGNOSIS — R0602 Shortness of breath: Secondary | ICD-10-CM | POA: Diagnosis not present

## 2020-06-20 ENCOUNTER — Ambulatory Visit: Payer: 59 | Admitting: Pulmonary Disease

## 2020-06-20 ENCOUNTER — Encounter: Payer: Self-pay | Admitting: Pulmonary Disease

## 2020-06-20 ENCOUNTER — Other Ambulatory Visit: Payer: Self-pay

## 2020-06-20 VITALS — BP 128/82 | HR 81 | Temp 97.3°F | Ht 68.0 in | Wt 202.6 lb

## 2020-06-20 DIAGNOSIS — R0600 Dyspnea, unspecified: Secondary | ICD-10-CM

## 2020-06-20 DIAGNOSIS — R0609 Other forms of dyspnea: Secondary | ICD-10-CM

## 2020-06-20 NOTE — Progress Notes (Signed)
Carlos Young    [id number]    1950/12/17  Primary Care Physician:Ross, Darlen Round, MD  Referring Physician: Daisy Floro, MD [address] [place],  [place] [address]  Chief complaint:   Patient is being seen for shortness of breath  HPI:  Patient with shortness of breath on exertion Denies any other associated symptoms  Reformed smoker Quit many years ago  No significant occupational history-no inhalational exposures  No family history of lung disease   Medical history of hypertension, no history of heart disease  Outpatient Encounter Medications as of 06/20/2020  Medication Sig  . acidophilus (RISAQUAD) CAPS capsule Take by mouth daily.  Marland Kitchen amLODipine (NORVASC) 2.5 MG tablet Take 2.5 mg by mouth daily.  Marland Kitchen atorvastatin (LIPITOR) 20 MG tablet Take 20 mg by mouth daily.  . cholecalciferol (VITAMIN D) 1000 UNITS tablet Take 1,000 Units by mouth daily.  Marland Kitchen ibuprofen (ADVIL,MOTRIN) 200 MG tablet Take 400 mg by mouth every 6 (six) hours as needed for mild pain.  Marland Kitchen losartan (COZAAR) 50 MG tablet Take 50 mg by mouth daily.  Marland Kitchen omeprazole (PRILOSEC) 40 MG capsule Take 40 mg by mouth daily.  . polyethylene glycol (MIRALAX / GLYCOLAX) 17 g packet Take 17 g by mouth daily.  . vitamin B-12 (CYANOCOBALAMIN) 500 MCG tablet Take 500 mcg by mouth daily.  Marland Kitchen zinc gluconate 50 MG tablet Take 50 mg by mouth daily.  . [DISCONTINUED] HYDROcodone-acetaminophen (NORCO/VICODIN) 5-325 MG per tablet Take 1-2 tablets by mouth every 6 (six) hours as needed for moderate pain. (Patient not taking: Reported on 06/11/2018)  . [DISCONTINUED] ondansetron (ZOFRAN ODT) 8 MG disintegrating tablet Take 1 tablet (8 mg total) by mouth every 8 (eight) hours as needed for nausea or vomiting. (Patient not taking: Reported on 06/11/2018)   No facility-administered encounter medications on file as of 06/20/2020.    Allergies as of 06/20/2020 - Review Complete 06/20/2020  Allergen Reaction Noted  .  Sulfa antibiotics Other (See Comments) 10/26/2012  . Ace inhibitors Rash 10/26/2012  . Penicillins Rash 10/26/2012    Past Medical History:  Diagnosis Date  . Chronic kidney disease, stage III (moderate) (HCC)   . GERD (gastroesophageal reflux disease)   . Hyperlipidemia   . Hypertension   . Inguinal hernia   . Low back pain   . Myalgia and myositis, unspecified   . Sleep disturbance, unspecified   . Unspecified vitamin D deficiency     Past Surgical History:  Procedure Laterality Date  . HERNIA REPAIR  2008 est   ing. hernia    Family History  Problem Relation Age of Onset  . Cancer Mother        lung ca  . Stroke Father   . Cancer Sister        breast ca  late 30s   . Stroke Sister   . Arthritis Sister     Social History   Socioeconomic History  . Marital status: Widowed    Spouse name: Not on file  . Number of children: Not on file  . Years of education: Not on file  . Highest education level: Not on file  Occupational History  . Not on file  Tobacco Use  . Smoking status: Former Smoker    Quit date: 10/26/1992    Years since quitting: 27.6  . Smokeless tobacco: Never Used  Substance and Sexual Activity  . Alcohol use: Yes    Alcohol/week: 1.0 standard drink  Types: 1 Cans of beer per week    Comment: per week or less  . Drug use: No  . Sexual activity: Not on file  Other Topics Concern  . Not on file  Social History Narrative  . Not on file   Social Determinants of Health   Financial Resource Strain:   . Difficulty of Paying Living Expenses: Not on file  Food Insecurity:   . Worried About Programme researcher, broadcasting/film/video in the Last Year: Not on file  . Ran Out of Food in the Last Year: Not on file  Transportation Needs:   . Lack of Transportation (Medical): Not on file  . Lack of Transportation (Non-Medical): Not on file  Physical Activity:   . Days of Exercise per Week: Not on file  . Minutes of Exercise per Session: Not on file  Stress:   .  Feeling of Stress : Not on file  Social Connections:   . Frequency of Communication with Friends and Family: Not on file  . Frequency of Social Gatherings with Friends and Family: Not on file  . Attends Religious Services: Not on file  . Active Member of Clubs or Organizations: Not on file  . Attends Banker Meetings: Not on file  . Marital Status: Not on file  Intimate Partner Violence:   . Fear of Current or Ex-Partner: Not on file  . Emotionally Abused: Not on file  . Physically Abused: Not on file  . Sexually Abused: Not on file    Review of Systems  Constitutional: Negative.   HENT: Negative.   Respiratory: Positive for shortness of breath. Negative for cough and wheezing.   Cardiovascular: Negative for chest pain.  Gastrointestinal: Negative.     Vitals:   06/20/20 1015  BP: 128/82  Pulse: 81  SpO2: 98%     Physical Exam Constitutional:      Appearance: He is obese.  HENT:     Nose: No congestion.     Mouth/Throat:     Mouth: Mucous membranes are moist.     Pharynx: No oropharyngeal exudate.  Cardiovascular:     Rate and Rhythm: Normal rate and regular rhythm.     Heart sounds: No murmur heard.  No friction rub.  Pulmonary:     Effort: No respiratory distress.     Breath sounds: No stridor. No wheezing or rhonchi.  Musculoskeletal:     Cervical back: No rigidity or tenderness.  Neurological:     Mental Status: He is alert.     Cranial Nerves: No cranial nerve deficit.  Psychiatric:        Mood and Affect: Mood normal.    Data Reviewed: CT scan from 2012 was reviewed with the patient-mild atelectasis at the base  CT scan of the abdomen from 2017 showing lower lobes of the lungs reviewed  Assessment:  Shortness of breath on exertion Reformed smoker -Quit smoking a long time ago -Was never labeled with any lung disease at the time   Plan/Recommendations: Obtain pulmonary function test  Obtain echocardiogram for shortness of breath  on exertion  Graded exercises as tolerated  Follow-up in 3 months  Encouraged to call with any significant concerns Virl Diamond MD [facility] 06/20/2020, 10:16 AM  CC: Daisy Floro, MD

## 2020-06-20 NOTE — Patient Instructions (Signed)
Shortness of breath on exertion   Obtain pulmonary function testing  Echocardiogram for shortness of breath on exertion   Follow-up in 3 months    Graded exercises as tolerated

## 2020-06-26 ENCOUNTER — Other Ambulatory Visit: Payer: Self-pay

## 2020-06-26 ENCOUNTER — Ambulatory Visit (HOSPITAL_COMMUNITY)
Admission: RE | Admit: 2020-06-26 | Discharge: 2020-06-26 | Disposition: A | Discharge status: Home / Self Care | Payer: Medicare HMO | Source: Ambulatory Visit | Attending: Pulmonary Disease | Admitting: Pulmonary Disease

## 2020-06-26 DIAGNOSIS — R0609 Other forms of dyspnea: Secondary | ICD-10-CM

## 2020-06-26 DIAGNOSIS — R0600 Dyspnea, unspecified: Secondary | ICD-10-CM | POA: Insufficient documentation

## 2020-06-26 LAB — ECHOCARDIOGRAM COMPLETE
Area-P 1/2: 4.68 cm2
S' Lateral: 2.6 cm

## 2020-06-26 NOTE — Progress Notes (Signed)
  Echocardiogram 2D Echocardiogram has been performed.  Carlos Young 06/26/2020, 4:08 PM

## 2020-07-06 IMAGING — US US ABDOMEN COMPLETE
1 series · 14 of 25 positions shown · non-contrast
Comparison: CT abdomen and pelvis 03/30/2016.

CLINICAL DATA: Epigastric pain for 1 month. History of congenital
absence of the left kidney.

EXAM:
ABDOMEN ULTRASOUND COMPLETE

[Series 1: us abdomen complete · 0.25mm/px · 14 of 68 slices shown]
[im 1/68]
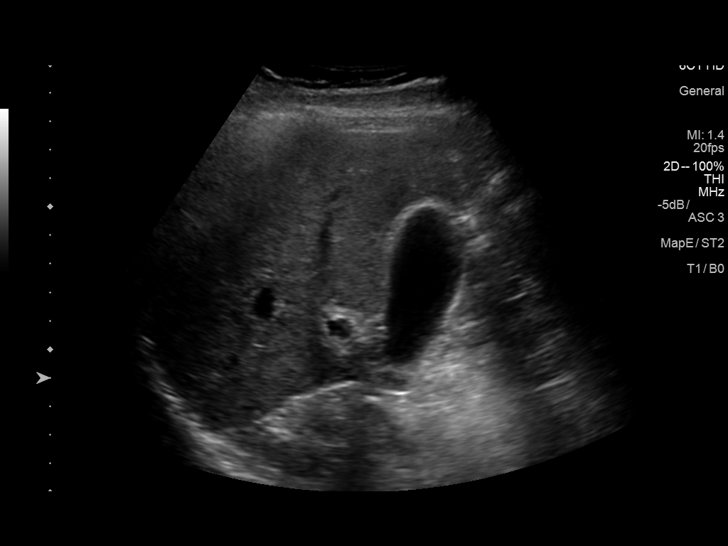
[im 6/68]
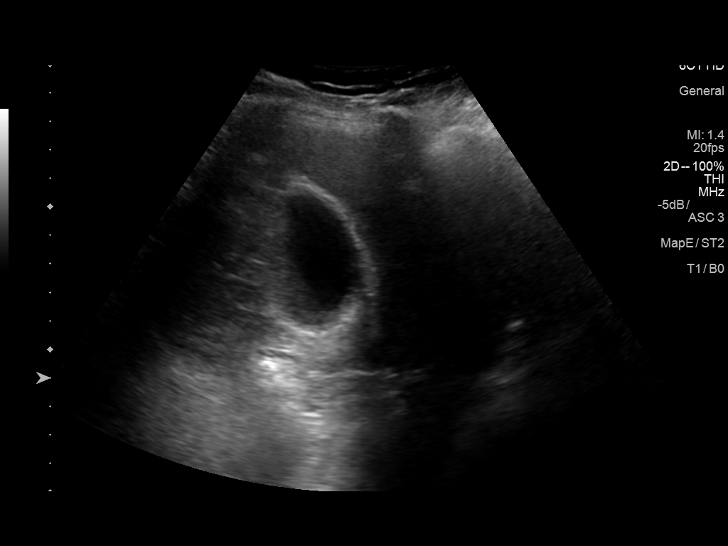
[im 12/68]
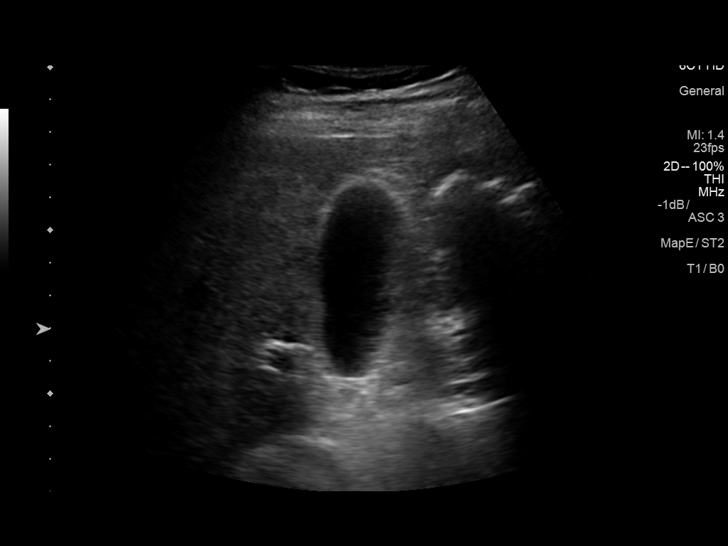
[im 17/68]
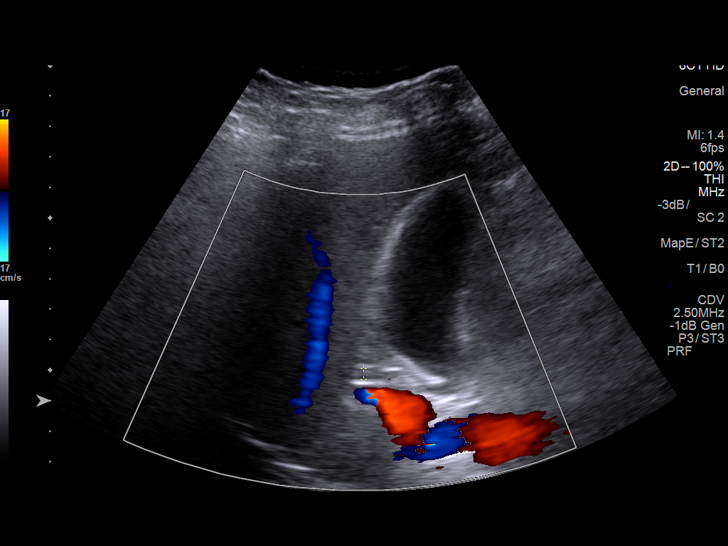
[im 23/68]
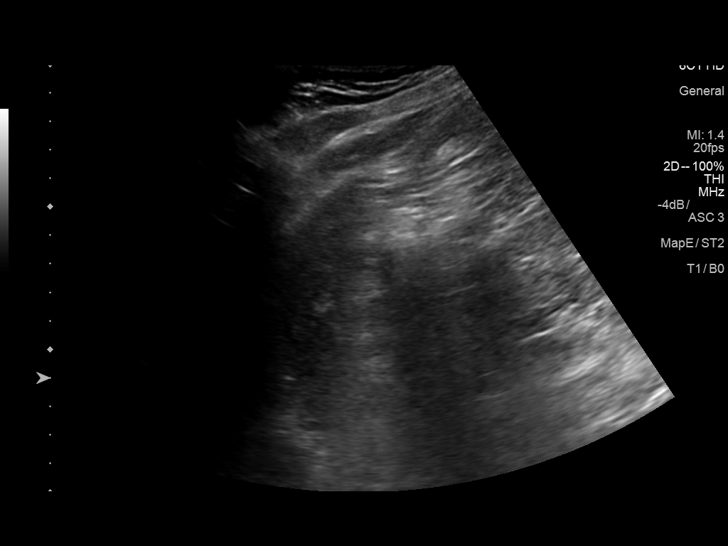
[im 26/68]
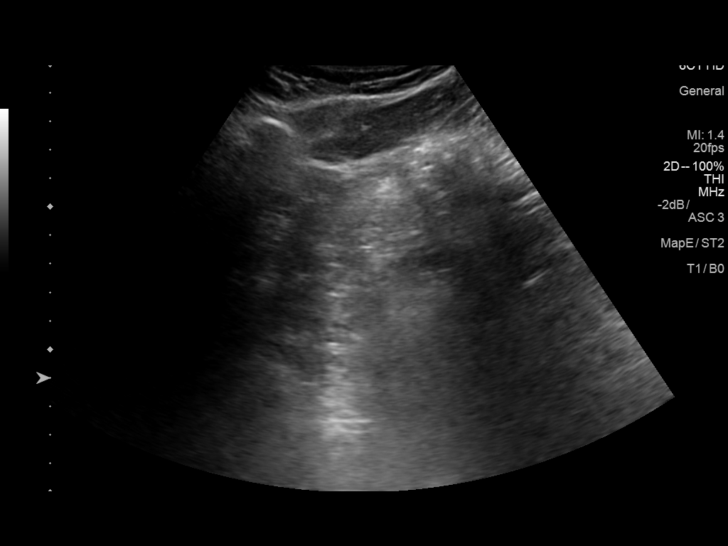
[im 31/68]
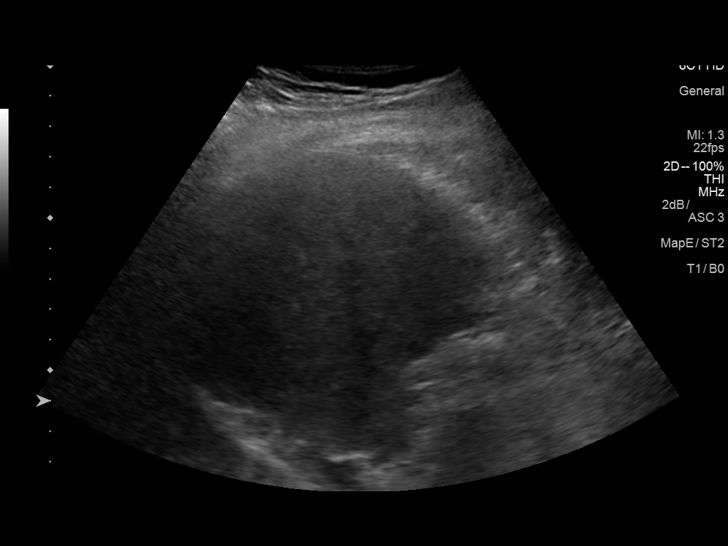
[im 37/68]
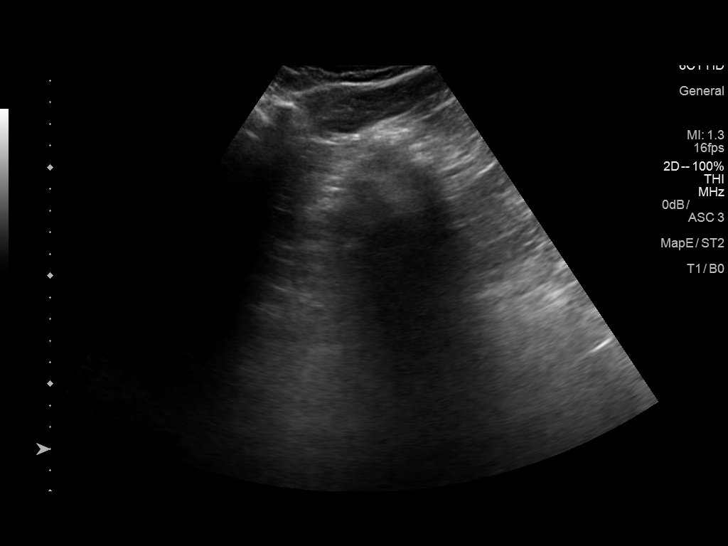
[im 42/68]
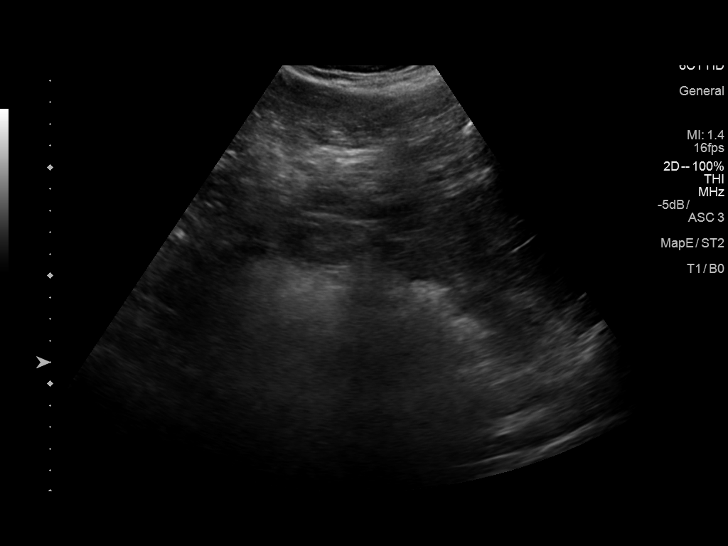
[im 45/68]
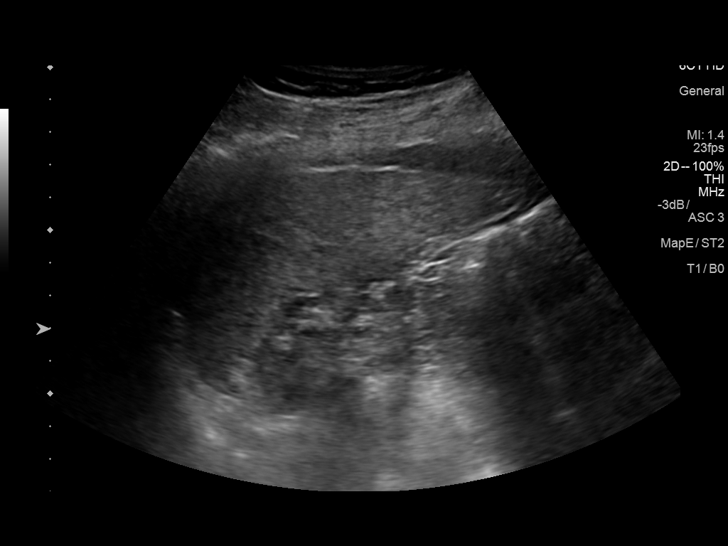
[im 51/68]
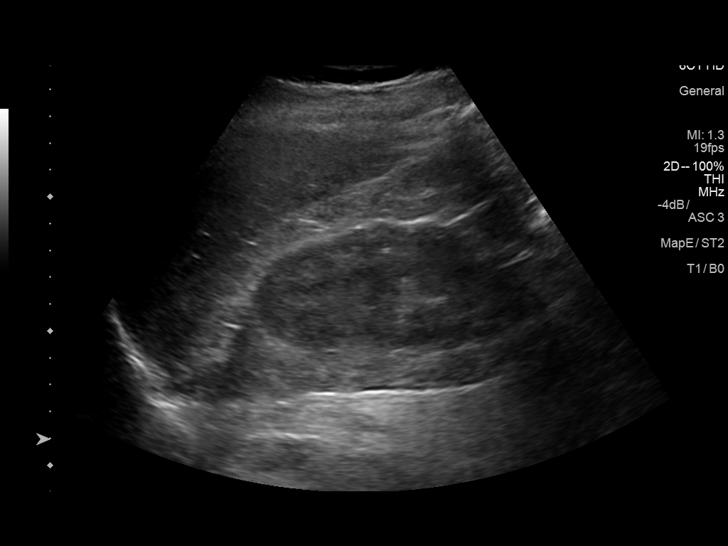
[im 56/68]
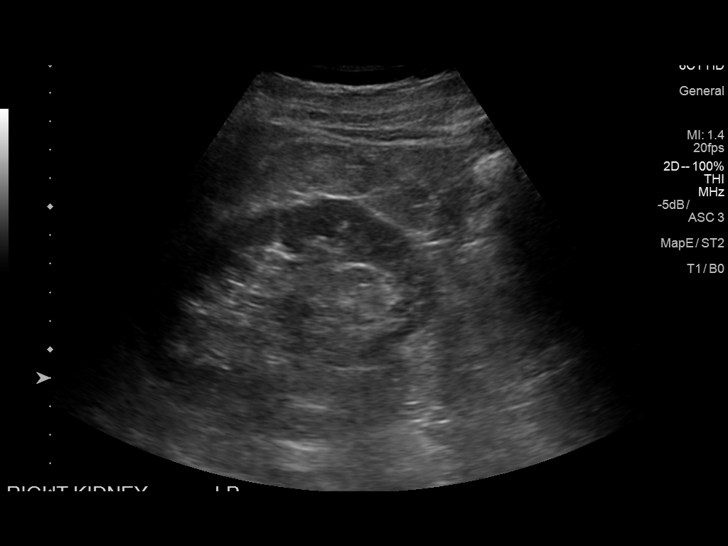
[im 62/68]
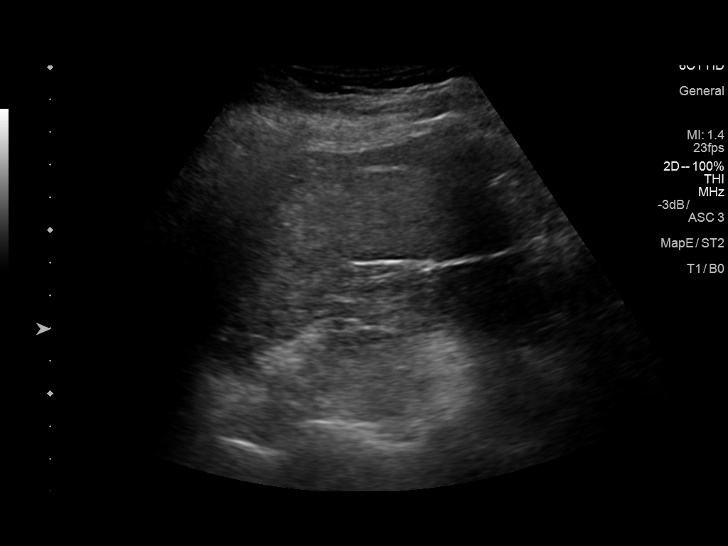
[im 68/68]
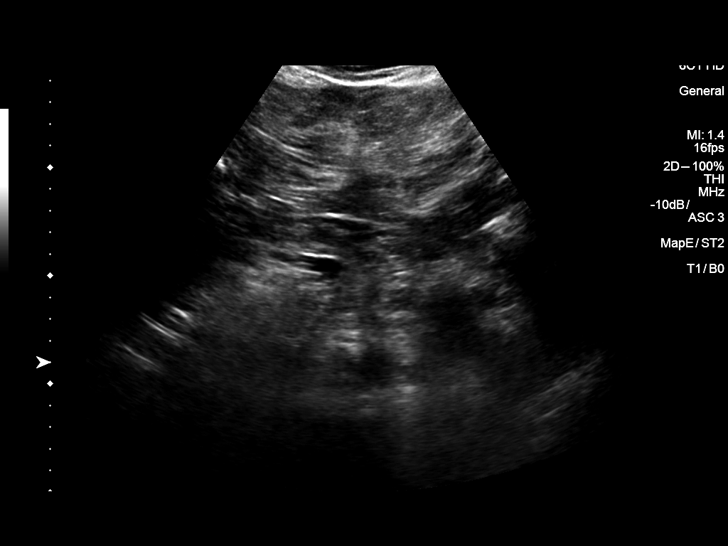

[14 of 25 positions shown; findings below may reference images not displayed]

FINDINGS: Gallbladder: No gallstones or wall thickening visualized. No
sonographic Murphy sign noted by sonographer.

Common bile duct: Diameter: 0.4 cm

Liver: No focal lesion identified. Within normal limits in
parenchymal echogenicity. Portal vein is patent on color Doppler
imaging with normal direction of blood flow towards the liver.

IVC: No abnormality visualized.

Pancreas: Visualized portion unremarkable.

Spleen: Size and appearance within normal limits.

Right Kidney: Length: 9.9 cm. Echogenicity within normal limits.
Cortex is thinned. No mass or hydronephrosis visualized.

Left Kidney: Congenitally absent.

Abdominal aorta: No aneurysm visualized.

Other findings: None.
IMPRESSION: Negative for gallstones. No acute abnormality or finding to explain
the patient's symptoms.

Solitary right kidney demonstrates some cortical thinning.

## 2020-12-05 DIAGNOSIS — Z0000 Encounter for general adult medical examination without abnormal findings: Secondary | ICD-10-CM | POA: Diagnosis not present

## 2020-12-05 DIAGNOSIS — I10 Essential (primary) hypertension: Secondary | ICD-10-CM | POA: Diagnosis not present

## 2020-12-05 DIAGNOSIS — E7800 Pure hypercholesterolemia, unspecified: Secondary | ICD-10-CM | POA: Diagnosis not present

## 2020-12-05 DIAGNOSIS — E559 Vitamin D deficiency, unspecified: Secondary | ICD-10-CM | POA: Diagnosis not present

## 2020-12-05 DIAGNOSIS — N183 Chronic kidney disease, stage 3 unspecified: Secondary | ICD-10-CM | POA: Diagnosis not present

## 2020-12-05 DIAGNOSIS — Z125 Encounter for screening for malignant neoplasm of prostate: Secondary | ICD-10-CM | POA: Diagnosis not present

## 2020-12-05 DIAGNOSIS — J019 Acute sinusitis, unspecified: Secondary | ICD-10-CM | POA: Diagnosis not present

## 2020-12-05 DIAGNOSIS — K219 Gastro-esophageal reflux disease without esophagitis: Secondary | ICD-10-CM | POA: Diagnosis not present
